# Patient Record
Sex: Female | Born: 2000 | Race: Black or African American | Hispanic: No | Marital: Single | Smoking: Current every day smoker
Health system: Southern US, Community
[De-identification: ages and names within clinical notes are randomized; demographics above are authoritative.]

## PROBLEM LIST (undated history)

## (undated) DIAGNOSIS — J45909 Unspecified asthma, uncomplicated: Secondary | ICD-10-CM

## (undated) HISTORY — PX: NO PAST SURGERIES: SHX2092

---

## 2020-10-31 ENCOUNTER — Inpatient Hospital Stay (HOSPITAL_COMMUNITY): Payer: Medicaid Other

## 2020-10-31 ENCOUNTER — Inpatient Hospital Stay (HOSPITAL_COMMUNITY)
Admission: EM | Admit: 2020-10-31 | Discharge: 2020-10-31 | Disposition: A | Discharge status: Home / Self Care | Payer: Medicaid Other | Attending: Obstetrics & Gynecology | Admitting: Obstetrics & Gynecology

## 2020-10-31 ENCOUNTER — Other Ambulatory Visit: Payer: Self-pay

## 2020-10-31 ENCOUNTER — Encounter (HOSPITAL_COMMUNITY): Payer: Self-pay | Admitting: Emergency Medicine

## 2020-10-31 DIAGNOSIS — Z3491 Encounter for supervision of normal pregnancy, unspecified, first trimester: Secondary | ICD-10-CM

## 2020-10-31 DIAGNOSIS — D279 Benign neoplasm of unspecified ovary: Secondary | ICD-10-CM

## 2020-10-31 DIAGNOSIS — Z3A01 Less than 8 weeks gestation of pregnancy: Secondary | ICD-10-CM | POA: Insufficient documentation

## 2020-10-31 DIAGNOSIS — O210 Mild hyperemesis gravidarum: Secondary | ICD-10-CM | POA: Insufficient documentation

## 2020-10-31 DIAGNOSIS — O99891 Other specified diseases and conditions complicating pregnancy: Secondary | ICD-10-CM | POA: Diagnosis not present

## 2020-10-31 DIAGNOSIS — O208 Other hemorrhage in early pregnancy: Secondary | ICD-10-CM | POA: Diagnosis not present

## 2020-10-31 DIAGNOSIS — O209 Hemorrhage in early pregnancy, unspecified: Secondary | ICD-10-CM

## 2020-10-31 DIAGNOSIS — O3481 Maternal care for other abnormalities of pelvic organs, first trimester: Secondary | ICD-10-CM

## 2020-10-31 DIAGNOSIS — D270 Benign neoplasm of right ovary: Secondary | ICD-10-CM | POA: Diagnosis not present

## 2020-10-31 DIAGNOSIS — O418X10 Other specified disorders of amniotic fluid and membranes, first trimester, not applicable or unspecified: Secondary | ICD-10-CM

## 2020-10-31 DIAGNOSIS — N83299 Other ovarian cyst, unspecified side: Secondary | ICD-10-CM

## 2020-10-31 DIAGNOSIS — O26899 Other specified pregnancy related conditions, unspecified trimester: Secondary | ICD-10-CM

## 2020-10-31 DIAGNOSIS — O468X1 Other antepartum hemorrhage, first trimester: Secondary | ICD-10-CM

## 2020-10-31 DIAGNOSIS — R109 Unspecified abdominal pain: Secondary | ICD-10-CM | POA: Insufficient documentation

## 2020-10-31 DIAGNOSIS — O26891 Other specified pregnancy related conditions, first trimester: Secondary | ICD-10-CM

## 2020-10-31 HISTORY — DX: Unspecified asthma, uncomplicated: J45.909

## 2020-10-31 LAB — URINALYSIS, ROUTINE W REFLEX MICROSCOPIC
Bacteria, UA: NONE SEEN
Bilirubin Urine: NEGATIVE
Glucose, UA: NEGATIVE mg/dL
Hgb urine dipstick: NEGATIVE
Ketones, ur: NEGATIVE mg/dL
Leukocytes,Ua: NEGATIVE
Nitrite: NEGATIVE
Protein, ur: 30 mg/dL — AB
Specific Gravity, Urine: 1.032 — ABNORMAL HIGH (ref 1.005–1.030)
pH: 5 (ref 5.0–8.0)

## 2020-10-31 LAB — CBC
HCT: 38.3 % (ref 36.0–46.0)
Hemoglobin: 12.7 g/dL (ref 12.0–15.0)
MCH: 27.1 pg (ref 26.0–34.0)
MCHC: 33.2 g/dL (ref 30.0–36.0)
MCV: 81.7 fL (ref 80.0–100.0)
Platelets: 275 10*3/uL (ref 150–400)
RBC: 4.69 MIL/uL (ref 3.87–5.11)
RDW: 13 % (ref 11.5–15.5)
WBC: 7.6 10*3/uL (ref 4.0–10.5)
nRBC: 0 % (ref 0.0–0.2)

## 2020-10-31 LAB — POC URINE PREG, ED: Preg Test, Ur: POSITIVE — AB

## 2020-10-31 LAB — WET PREP, GENITAL
Clue Cells Wet Prep HPF POC: NONE SEEN
Sperm: NONE SEEN
Trich, Wet Prep: NONE SEEN
Yeast Wet Prep HPF POC: NONE SEEN

## 2020-10-31 LAB — HCG, QUANTITATIVE, PREGNANCY: hCG, Beta Chain, Quant, S: 52059 m[IU]/mL — ABNORMAL HIGH (ref <5)

## 2020-10-31 LAB — ABO/RH: ABO/RH(D): A POS

## 2020-10-31 MED ORDER — PROMETHAZINE HCL 25 MG PO TABS: 25.0000 mg | ORAL_TABLET | Freq: Four times a day (QID) | ORAL | 0 refills | Status: DC | PRN

## 2020-10-31 MED ORDER — ONDANSETRON 4 MG PO TBDP: 4.0000 mg | ORAL_TABLET | Freq: Three times a day (TID) | ORAL | 0 refills | Status: DC | PRN

## 2020-10-31 MED ORDER — ONDANSETRON 4 MG PO TBDP
8.0000 mg | ORAL_TABLET | Freq: Once | ORAL | Status: AC
  Administered 2020-10-31: 8 mg via ORAL
  Filled 2020-10-31: qty 2

## 2020-10-31 NOTE — MAU Note (Signed)
Pt reports to mau with c/o lower abd pain that radiates to her back for the past week.  Pt states pain comes and goes and feels like mild period cramps.  Pt reports 1 episode of spotting on Friday, but nothing since then.  Pt endorses nausea and vomiting X 5 today.

## 2020-10-31 NOTE — ED Provider Notes (Signed)
Emergency Medicine Provider OB Triage Evaluation Note  Gina Holmes is a 20 y.o. female, G1P0 at Unknown gestation who presents to the emergency department with complaints of abdominal cramping x2 weeks and nausea. LMP 09/15/2020. Mild vaginal spotting 3 days ago. None since.   Review of  Systems  Positive: abdominal pain, nausea  Physical Exam  BP (!) 129/58 (BP Location: Left Arm)   Pulse 74   Temp 98.7 F (37.1 C) (Oral)   Resp 18   LMP 09/07/2020   SpO2 99%  General: Awake, no distress  HEENT: Atraumatic  Resp: Normal effort  Cardiac: Normal rate Abd: Nondistended, nontender  MSK: Moves all extremities without difficulty Neuro: Speech clear  Medical Decision Making  Pt evaluated for pregnancy concern and is stable for transfer to MAU. Pt is in agreement with plan for transfer.  6:19 PM Discussed with MAU APP, Sam, who accepts patient in transfer.  Clinical Impression  No diagnosis found. Of note, mini lab called Karolee Stamps, RN and states pregnancy test is positive; however, result has not crossed over. Sam APP at MAU made aware.    Suzy Bouchard, PA-C 10/31/20 1825    Lorelle Gibbs, DO 10/31/20 2116

## 2020-10-31 NOTE — Discharge Instructions (Signed)
Safe Medications in Pregnancy   Acne: Benzoyl Peroxide Salicylic Acid  Backache/Headache: Tylenol: 2 regular strength every 4 hours OR              2 Extra strength every 6 hours  Colds/Coughs/Allergies: Benadryl (alcohol free) 25 mg every 6 hours as needed Breath right strips Claritin Cepacol throat lozenges Chloraseptic throat spray Cold-Eeze- up to three times per day Cough drops, alcohol free Flonase (by prescription only) Guaifenesin Mucinex Robitussin DM (plain only, alcohol free) Saline nasal spray/drops Sudafed (pseudoephedrine) & Actifed ** use only after [redacted] weeks gestation and if you do not have high blood pressure Tylenol Vicks Vaporub Zinc lozenges Zyrtec   Constipation: Colace Ducolax suppositories Fleet enema Glycerin suppositories Metamucil Milk of magnesia Miralax Senokot Smooth move tea  Diarrhea: Kaopectate Imodium A-D  *NO pepto Bismol  Hemorrhoids: Anusol Anusol HC Preparation H Tucks  Indigestion: Tums Maalox Mylanta Zantac  Pepcid  Insomnia: Benadryl (alcohol free) 25mg  every 6 hours as needed Tylenol PM Unisom, no Gelcaps  Leg Cramps: Tums MagGel  Nausea/Vomiting:  Bonine Dramamine Emetrol Ginger extract Sea bands Meclizine  Nausea medication to take during pregnancy:  Unisom (doxylamine succinate 25 mg tablets) Take one tablet daily at bedtime. If symptoms are not adequately controlled, the dose can be increased to a maximum recommended dose of two tablets daily (1/2 tablet in the morning, 1/2 tablet mid-afternoon and one at bedtime). Vitamin B6 100mg  tablets. Take one tablet twice a day (up to 200 mg per day).  Skin Rashes: Aveeno products Benadryl cream or 25mg  every 6 hours as needed Calamine Lotion 1% cortisone cream  Yeast infection: Gyne-lotrimin 7 Monistat 7   **If taking multiple medications, please check labels to avoid duplicating the same active ingredients **take medication as directed on  the label ** Do not exceed 4000 mg of tylenol in 24 hours **Do not take medications that contain aspirin or ibuprofen    Obstetrics: Normal and Problem Pregnancies (7th ed., pp. 102-121). [place], [place]: Elsevier."> Textbook of Family Medicine (9th ed., pp. [phone]). [place], [place]: Elsevier [employer].">  First Trimester of Pregnancy  The first trimester of pregnancy starts on the first day of your last menstrual period until the end of week 12. This is months 1 through 3 of pregnancy. A week after a sperm fertilizes an egg, the egg will implant into the wall of the uterus and begin to develop into a baby. By the end of 12 weeks, all the baby's organs will be formed and the baby will be 2-3 inches in size. Body changes during your first trimester Your body goes through many changes during pregnancy. The changes vary and generally return to normal after your baby is born. Physical changes  You may gain or lose weight.  Your breasts may begin to grow larger and become tender. The tissue that surrounds your nipples (areola) may become darker.  Dark spots or blotches (chloasma or mask of pregnancy) may develop on your face.  You may have changes in your hair. These can include thickening or thinning of your hair or changes in texture. Health changes  You may feel nauseous, and you may vomit.  You may have heartburn.  You may develop headaches.  You may develop constipation.  Your gums may bleed and may be sensitive to brushing and flossing. Other changes  You may tire easily.  You may urinate more often.  Your menstrual periods will stop.  You may have a loss of appetite.  You may develop  cravings for certain kinds of food.  You may have changes in your emotions from day to day.  You may have more vivid and strange dreams. Follow these instructions at home: Medicines  Follow your health care provider's instructions regarding medicine use. Specific medicines may be  either safe or unsafe to take during pregnancy. Do not take any medicines unless told to by your health care provider.  Take a prenatal vitamin that contains at least 600 micrograms (mcg) of folic acid. Eating and drinking  Eat a healthy diet that includes fresh fruits and vegetables, whole grains, good sources of protein such as meat, eggs, or tofu, and low-fat dairy products.  Avoid raw meat and unpasteurized juice, milk, and cheese. These carry germs that can harm you and your baby.  If you feel nauseous or you vomit: ? Eat 4 or 5 small meals a day instead of 3 large meals. ? Try eating a few soda crackers. ? Drink liquids between meals instead of during meals.  You may need to take these actions to prevent or treat constipation: ? Drink enough fluid to keep your urine pale yellow. ? Eat foods that are high in fiber, such as beans, whole grains, and fresh fruits and vegetables. ? Limit foods that are high in fat and processed sugars, such as fried or sweet foods. Activity  Exercise only as directed by your health care provider. Most people can continue their usual exercise routine during pregnancy. Try to exercise for 30 minutes at least 5 days a week.  Stop exercising if you develop pain or cramping in the lower abdomen or lower back.  Avoid exercising if it is very hot or humid or if you are at high altitude.  Avoid heavy lifting.  If you choose to, you may have sex unless your health care provider tells you not to. Relieving pain and discomfort  Wear a good support bra to relieve breast tenderness.  Rest with your legs elevated if you have leg cramps or low back pain.  If you develop bulging veins (varicose veins) in your legs: ? Wear support hose as told by your health care provider. ? Elevate your feet for 15 minutes, 3-4 times a day. ? Limit salt in your diet. Safety  Wear your seat belt at all times when driving or riding in a car.  Talk with your health care  provider if someone is verbally or physically abusive to you.  Talk with your health care provider if you are feeling sad or have thoughts of hurting yourself. Lifestyle  Do not use hot tubs, steam rooms, or saunas.  Do not douche. Do not use tampons or scented sanitary pads.  Do not use herbal remedies, alcohol, illegal drugs, or medicines that are not approved by your health care provider. Chemicals in these products can harm your baby.  Do not use any products that contain nicotine or tobacco, such as cigarettes, e-cigarettes, and chewing tobacco. If you need help quitting, ask your health care provider.  Avoid cat litter boxes and soil used by cats. These carry germs that can cause birth defects in the baby and possibly loss of the unborn baby (fetus) by miscarriage or stillbirth. General instructions  During routine prenatal visits in the first trimester, your health care provider will do a physical exam, perform necessary tests, and ask you how things are going. Keep all follow-up visits. This is important.  Ask for help if you have counseling or nutritional needs during pregnancy. Your  health care provider can offer advice or refer you to specialists for help with various needs.  Schedule a dentist appointment. At home, brush your teeth with a soft toothbrush. Floss gently.  Write down your questions. Take them to your prenatal visits. Where to find more information  American Pregnancy Association: americanpregnancy.[place] and Gynecologists: PoolDevices.com.pt  Office on [employer]: KeywordPortfolios.com.br Contact a health care provider if you have:  Dizziness.  A fever.  Mild pelvic cramps, pelvic pressure, or nagging pain in the abdominal area.  Nausea, vomiting, or diarrhea that lasts for 24 hours or longer.  A bad-smelling vaginal discharge.  Pain when you urinate.  Known exposure to a contagious illness,  such as chickenpox, measles, Zika virus, HIV, or hepatitis. Get help right away if you have:  Spotting or bleeding from your vagina.  Severe abdominal cramping or pain.  Shortness of breath or chest pain.  Any kind of trauma, such as from a fall or a car crash.  New or increased pain, swelling, or redness in an arm or leg. Summary  The first trimester of pregnancy starts on the first day of your last menstrual period until the end of week 12 (months 1 through 3).  Eating 4 or 5 small meals a day rather than 3 large meals may help to relieve nausea and vomiting.  Do not use any products that contain nicotine or tobacco, such as cigarettes, e-cigarettes, and chewing tobacco. If you need help quitting, ask your health care provider.  Keep all follow-up visits. This is important. This information is not intended to replace advice given to you by your health care provider. Make sure you discuss any questions you have with your health care provider. Document Revised: 12/03/2019 Document Reviewed: 10/09/2019 Elsevier Patient Education  2021 [employer].

## 2020-10-31 NOTE — ED Triage Notes (Signed)
Pt reports + home preg test 1 week ago.  C/o abd cramping and nausea.

## 2020-10-31 NOTE — ED Notes (Signed)
Urine preg + per Mini lab.  PA at triage for MSE.  Report called to Sharin Mons.  Shawn, transporter notified of pt ready for MAU transport.

## 2020-10-31 NOTE — MAU Provider Note (Signed)
History     CSN: [id number]  Arrival date and time: 10/31/20 1714   Event Date/Time   First Provider Initiated Contact with Patient 10/31/20 1917      Chief Complaint  Patient presents with  . Abdominal Pain  . Back Pain  . Emesis  . Nausea   HPI Gina Holmes is a 20 y.o. G1P0 at [redacted]w[redacted]d who presents from [facility] with abdominal pain and nausea. She reports lower abdominal cramping that has been ongoing for 2-3 days. She rates the pain a 5/10 and has not tried anything for the pain. She denies any vaginal bleeding or discharge. She also reports nausea and vomiting 5 times today.   OB History    Gravida  1   Para      Term      Preterm      AB      Living        SAB      IAB      Ectopic      Multiple      Live Births              Past Medical History:  Diagnosis Date  . Asthma     History reviewed. No pertinent surgical history.  History reviewed. No pertinent family history.  Social History   Tobacco Use  . Smoking status: Never Smoker  . Smokeless tobacco: Never Used  Substance Use Topics  . Alcohol use: Not Currently  . Drug use: Yes    Types: Marijuana    Allergies: Not on File  No medications prior to admission.    Review of Systems  Constitutional: Negative.  Negative for fatigue and fever.  HENT: Negative.   Respiratory: Negative.  Negative for shortness of breath.   Cardiovascular: Negative.  Negative for chest pain.  Gastrointestinal: Positive for abdominal pain, nausea and vomiting. Negative for constipation and diarrhea.  Genitourinary: Negative.  Negative for dysuria, vaginal bleeding and vaginal discharge.  Neurological: Negative.  Negative for dizziness and headaches.   Physical Exam   Blood pressure (!) 135/52, pulse 64, temperature 98.5 F (36.9 C), temperature source Oral, resp. rate 17, last menstrual period 09/15/2020, SpO2 98 %.  Physical Exam Vitals and nursing note reviewed.  Constitutional:      General: She  is not in acute distress.    Appearance: She is well-developed.  HENT:     Head: Normocephalic.  Eyes:     Pupils: Pupils are equal, round, and reactive to light.  Cardiovascular:     Rate and Rhythm: Normal rate and regular rhythm.     Heart sounds: Normal heart sounds.  Pulmonary:     Effort: Pulmonary effort is normal. No respiratory distress.     Breath sounds: Normal breath sounds.  Abdominal:     General: Bowel sounds are normal. There is no distension.     Palpations: Abdomen is soft.     Tenderness: There is no abdominal tenderness.  Skin:    General: Skin is warm and dry.  Neurological:     Mental Status: She is alert and oriented to person, place, and time.  Psychiatric:        Behavior: Behavior normal.        Thought Content: Thought content normal.        Judgment: Judgment normal.     MAU Course  Procedures Results for orders placed or performed during the hospital encounter of 10/31/20 (from the past 24 hour(s))  POC Urine Pregnancy, ED (not at [facility])     Status: Abnormal   Collection Time: 10/31/20  6:15 PM  Result Value Ref Range   Preg Test, Ur POSITIVE (A) NEGATIVE  Wet prep, genital     Status: Abnormal   Collection Time: 10/31/20  7:20 PM  Result Value Ref Range   Yeast Wet Prep HPF POC NONE SEEN NONE SEEN   Trich, Wet Prep NONE SEEN NONE SEEN   Clue Cells Wet Prep HPF POC NONE SEEN NONE SEEN   WBC, Wet Prep HPF POC FEW (A) NONE SEEN   Sperm NONE SEEN   Urinalysis, Routine w reflex microscopic     Status: Abnormal   Collection Time: 10/31/20  7:22 PM  Result Value Ref Range   Color, Urine YELLOW YELLOW   APPearance HAZY (A) CLEAR   Specific Gravity, Urine 1.032 (H) 1.005 - 1.030   pH 5.0 5.0 - 8.0   Glucose, UA NEGATIVE NEGATIVE mg/dL   Hgb urine dipstick NEGATIVE NEGATIVE   Bilirubin Urine NEGATIVE NEGATIVE   Ketones, ur NEGATIVE NEGATIVE mg/dL   Protein, ur 30 (A) NEGATIVE mg/dL   Nitrite NEGATIVE NEGATIVE   Leukocytes,Ua NEGATIVE NEGATIVE    RBC / HPF 0-5 0 - 5 RBC/hpf   WBC, UA 0-5 0 - 5 WBC/hpf   Bacteria, UA NONE SEEN NONE SEEN   Squamous Epithelial / LPF 0-5 0 - 5   Mucus PRESENT   CBC     Status: None   Collection Time: 10/31/20  7:24 PM  Result Value Ref Range   WBC 7.6 4.0 - 10.5 K/uL   RBC 4.69 3.87 - 5.11 MIL/uL   Hemoglobin 12.7 12.0 - 15.0 g/dL   HCT 38.3 36.0 - 46.0 %   MCV 81.7 80.0 - 100.0 fL   MCH 27.1 26.0 - 34.0 pg   MCHC 33.2 30.0 - 36.0 g/dL   RDW 13.0 11.5 - 15.5 %   Platelets 275 150 - 400 K/uL   nRBC 0.0 0.0 - 0.2 %  ABO/Rh     Status: None   Collection Time: 10/31/20  7:24 PM  Result Value Ref Range   ABO/RH(D) A POS    No rh immune globuloin      NOT A RH IMMUNE GLOBULIN CANDIDATE, PT RH POSITIVE Performed at [facility], [address] [place], [place] [address]    US OB LESS THAN 14 WEEKS WITH [place] TRANSVAGINAL  Result Date: 10/31/2020 CLINICAL DATA:  Abdominal and back pain. Estimated gestational age by LMP is 6 weeks 4 days. Quantitative beta HCG is pending. EXAM: OBSTETRIC <14 WK [place] AND TRANSVAGINAL OB US TECHNIQUE: Both transabdominal and transvaginal ultrasound examinations were performed for complete evaluation of the gestation as well as the maternal uterus, adnexal regions, and pelvic cul-de-sac. Transvaginal technique was performed to assess early pregnancy. COMPARISON:  None. FINDINGS: Intrauterine gestational sac: A single intrauterine gestational sac is present. Yolk sac:  The yolk sac is visualized. Embryo:  The fetal pole is visualized. Cardiac Activity: Fetal cardiac activity is observed. Heart Rate: 116 bpm CRL:  4.8 mm   6 w   1 d                  [place] EDC: 06/25/2021 Subchorionic hemorrhage: A small subchorionic hemorrhage is identified inferiorly. Maternal uterus/adnexae: The uterus is retroverted. No myometrial mass lesions are identified. Both ovaries are visualized. The right ovary contains a heterogeneous mixed echotexture structure with hyperechoic changes predominant.  This measures about 2.7 cm in maximal diameter. Likely an ovarian dermoid cyst. The left ovary contains a corpus luteal cyst. Small amount of free fluid in the pelvis. IMPRESSION: 1. Single intrauterine pregnancy. Estimated gestational age by crown-rump length is 6 weeks 1 day. No acute complication is suggested sonographically. 2. Probable ovarian dermoid cyst in the right ovary measuring 2.7 cm maximal diameter. Electronically Signed   By: Lucienne Capers M.D.   On: 10/31/2020 20:10    MDM UA, UPT CBC, HCG ABO/Rh-  A Pos Wet prep and gc/chlamydia US OB Comp Less 14 weeks with Transvaginal  Zofran 8mg  ODT Assessment and Plan   1. Normal intrauterine pregnancy on prenatal ultrasound in first trimester   2. Abdominal pain affecting pregnancy   3. [redacted] weeks gestation of pregnancy   4. Subchorionic hemorrhage of placenta in first trimester, single or unspecified fetus   5. Ovarian dermoid cyst complicating pregnancy, antepartum    -Discharge home in stable condition -Rx for zofran and phenergan sent to patient's pharmacy -First trimester precautions discussed -Patient advised to follow-up with OB to establish prenatal care -Patient may return to MAU as needed or if her condition were to change or worsen   Wende Mott CNM 10/31/2020, 8:23 PM

## 2020-11-01 LAB — GC/CHLAMYDIA PROBE AMP (~~LOC~~) NOT AT ARMC
Chlamydia: NEGATIVE
Comment: NEGATIVE
Comment: NORMAL
Neisseria Gonorrhea: NEGATIVE

## 2020-11-15 ENCOUNTER — Inpatient Hospital Stay (HOSPITAL_COMMUNITY)
Admission: EM | Admit: 2020-11-15 | Discharge: 2020-11-16 | Disposition: A | Discharge status: Home / Self Care | Payer: Medicaid Other | Attending: Family Medicine | Admitting: Family Medicine

## 2020-11-15 ENCOUNTER — Other Ambulatory Visit: Payer: Self-pay

## 2020-11-15 DIAGNOSIS — O210 Mild hyperemesis gravidarum: Secondary | ICD-10-CM

## 2020-11-15 DIAGNOSIS — Z3A08 8 weeks gestation of pregnancy: Secondary | ICD-10-CM | POA: Insufficient documentation

## 2020-11-15 DIAGNOSIS — O219 Vomiting of pregnancy, unspecified: Secondary | ICD-10-CM

## 2020-11-15 DIAGNOSIS — Z79899 Other long term (current) drug therapy: Secondary | ICD-10-CM | POA: Insufficient documentation

## 2020-11-15 NOTE — MAU Note (Signed)
PT SAYS  SHE WENT TO [place] TONIGHT AT 10PM- FOR VOMITING   VOMITING STARTED WED- HAS BEEN HERE BEFORE- GAVE MEDS FOR VOMITING TOOK PHENERGAN- AT 5 PM - VOMITED 10 MIN LATER  NO ZOFRAN TODAY  NO DR YET.

## 2020-11-15 NOTE — ED Notes (Signed)
Provider at bedside

## 2020-11-15 NOTE — ED Triage Notes (Signed)
[redacted] weeks pregnant with vomiting x 5days and unable to keep anything down. Also reports lower abdominal pain.

## 2020-11-15 NOTE — ED Notes (Signed)
Called transport at this time.

## 2020-11-15 NOTE — ED Provider Notes (Signed)
Emergency Medicine Provider OB Triage Evaluation Note  Gina Holmes is a 20 y.o. female, G1P0, at [redacted]w[redacted]d gestation who presents to the emergency department with complaints of G2P1 - (prior miscarriage), n/v X 1 week - no improvement with meds - no abd pain.  Review of  Systems  Positive: n/v  Negative: f/c/sob/cough  Physical Exam  BP 115/63 (BP Location: Left Arm)   Pulse 86   Temp 99.5 F (37.5 C) (Oral)   Resp 16   Ht 1.549 m (5\' 1" )   Wt 86.2 kg   LMP 09/15/2020   SpO2 100%   BMI 35.90 kg/m  General: Awake, no distress  HEENT: Atraumatic  Resp: Normal effort  Cardiac: Normal rate Abd: Nondistended, nontender  MSK: Moves all extremities without difficulty Neuro: Speech clear  Medical Decision Making  Pt evaluated for pregnancy concern and is stable for transfer to MAU. Pt is in agreement with plan for transfer.  10:37 PM Discussed with MAU APP,  who accepts patient in transfer.  Clinical Impression  No diagnosis found.     Noemi Chapel, MD 11/15/20 2240

## 2020-11-15 NOTE — ED Notes (Signed)
Called for provider to see to be cleared for MAU.

## 2020-11-16 ENCOUNTER — Encounter (HOSPITAL_COMMUNITY): Payer: Self-pay | Admitting: Family Medicine

## 2020-11-16 DIAGNOSIS — Z79899 Other long term (current) drug therapy: Secondary | ICD-10-CM | POA: Diagnosis not present

## 2020-11-16 DIAGNOSIS — O210 Mild hyperemesis gravidarum: Secondary | ICD-10-CM | POA: Diagnosis present

## 2020-11-16 DIAGNOSIS — O219 Vomiting of pregnancy, unspecified: Secondary | ICD-10-CM | POA: Diagnosis not present

## 2020-11-16 DIAGNOSIS — Z3A08 8 weeks gestation of pregnancy: Secondary | ICD-10-CM | POA: Diagnosis not present

## 2020-11-16 LAB — URINALYSIS, ROUTINE W REFLEX MICROSCOPIC
Bacteria, UA: NONE SEEN
Bilirubin Urine: NEGATIVE
Glucose, UA: NEGATIVE mg/dL
Hgb urine dipstick: NEGATIVE
Ketones, ur: 5 mg/dL — AB
Leukocytes,Ua: NEGATIVE
Nitrite: NEGATIVE
Protein, ur: 30 mg/dL — AB
Specific Gravity, Urine: 1.023 (ref 1.005–1.030)
pH: 5 (ref 5.0–8.0)

## 2020-11-16 MED ORDER — ONDANSETRON 4 MG PO TBDP
8.0000 mg | ORAL_TABLET | Freq: Once | ORAL | Status: AC
  Administered 2020-11-16: 8 mg via ORAL
  Filled 2020-11-16: qty 2

## 2020-11-16 NOTE — MAU Provider Note (Signed)
Event Date/Time   First Provider Initiated Contact with Patient 11/16/20 0336     S Ms. Gina Holmes is a 20 y.o. G1P0 pregnant female at [redacted]w[redacted]d who presents to MAU today with complaint of nausea/vomiting related to pregnancy. Has presented to MAU for this issue before and been prescribed zofran and phenergan. This bout of vomiting began on Wednesday, tried taking phenergan this morning but threw it up 40min later. Has not tried zofran today. Presented initially to [facility] ED, then transferred to [place]. Last emesis prior to arrival to hospital. Denies any other physical symptoms.  Has not established routine OB care yet.  Pertinent items noted in HPI and remainder of comprehensive ROS otherwise negative.  O BP (!) 99/48 (BP Location: Right Arm)   Pulse 79   Temp 98.3 F (36.8 C) (Oral)   Resp 18   Ht 5' 1.5" (1.562 m)   Wt 190 lb 14.4 oz (86.6 kg)   LMP 09/15/2020   SpO2 100%   BMI 35.49 kg/m  Physical Exam Vitals and nursing note reviewed.  Constitutional:      General: She is not in acute distress.    Appearance: She is not ill-appearing.  HENT:     Head: Normocephalic and atraumatic.     Mouth/Throat:     Mouth: Mucous membranes are moist.  Eyes:     Pupils: Pupils are equal, round, and reactive to light.  Cardiovascular:     Rate and Rhythm: Normal rate.  Pulmonary:     Effort: Pulmonary effort is normal.  Abdominal:     General: Bowel sounds are normal.     Palpations: Abdomen is soft.     Tenderness: There is no abdominal tenderness.  Musculoskeletal:        General: Normal range of motion.  Skin:    General: Skin is warm and dry.     Capillary Refill: Capillary refill takes less than 2 seconds.     Coloration: Skin is not jaundiced or pale.  Neurological:     Mental Status: She is alert and oriented to person, place, and time.  Psychiatric:        Mood and Affect: Mood normal.        Behavior: Behavior normal.        Thought Content: Thought content normal.         Judgment: Judgment normal.    Results for orders placed or performed during the hospital encounter of 11/15/20 (from the past 24 hour(s))  Urinalysis, Routine w reflex microscopic Urine, Clean Catch     Status: Abnormal   Collection Time: 11/16/20  3:24 AM  Result Value Ref Range   Color, Urine YELLOW YELLOW   APPearance CLOUDY (A) CLEAR   Specific Gravity, Urine 1.023 1.005 - 1.030   pH 5.0 5.0 - 8.0   Glucose, UA NEGATIVE NEGATIVE mg/dL   Hgb urine dipstick NEGATIVE NEGATIVE   Bilirubin Urine NEGATIVE NEGATIVE   Ketones, ur 5 (A) NEGATIVE mg/dL   Protein, ur 30 (A) NEGATIVE mg/dL   Nitrite NEGATIVE NEGATIVE   Leukocytes,Ua NEGATIVE NEGATIVE   RBC / HPF 0-5 0 - 5 RBC/hpf   WBC, UA 0-5 0 - 5 WBC/hpf   Bacteria, UA NONE SEEN NONE SEEN   Squamous Epithelial / LPF 0-5 0 - 5   Mucus PRESENT    Pt able to tolerate po zofran, water and juice. Encouraged use of zofran and educated on use of phenergan vaginally if she cannot tolerate it orally.  Reviewed ways to manage hyperemesis.  A Nausea and vomiting of pregnancy [redacted] weeks gestation  P Discharge from MAU in stable condition with first trimester precautions Call [facility] to make appointment for routine OB care (pt has no insurance yet) Allergies as of 11/16/2020   No Known Allergies     Medication List    TAKE these medications   ondansetron 4 MG disintegrating tablet Commonly known as: Zofran ODT Take 1 tablet (4 mg total) by mouth every 8 (eight) hours as needed for nausea or vomiting.   promethazine 25 MG tablet Commonly known as: PHENERGAN Take 1 tablet (25 mg total) by mouth every 6 (six) hours as needed for nausea or vomiting.      Gabriel Carina, [place] 11/16/2020 3:38 AM

## 2020-11-16 NOTE — Discharge Instructions (Signed)
Hyperemesis Gravidarum Hyperemesis gravidarum is a severe form of nausea and vomiting that happens during pregnancy. Hyperemesis is worse than morning sickness. It may cause you to have nausea or vomiting all day for many days. It may keep you from eating and drinking enough food and liquids, which can lead to dehydration, malnutrition, and weight loss. Hyperemesis usually occurs during the first half (the first 20 weeks) of pregnancy. It often goes away once a woman is in her second half of pregnancy. However, sometimes hyperemesis continues through an entire pregnancy. What are the causes? The cause of this condition is not known. It may be associated with:  Changes in hormones in the body during pregnancy.  Changes in the gastrointestinal system.  Genetic or inherited conditions. What are the signs or symptoms? Symptoms of this condition include:  Severe nausea and vomiting that does not go away.  Problems keeping food down.  Weight loss.  Loss of body fluid (dehydration).  Loss of appetite. You may have no desire to eat or you may not like the food you have previously enjoyed. How is this diagnosed? This condition may be diagnosed based on your medical history, your symptoms, and a physical exam. You may also have other tests, including:  Blood tests.  Urine tests.  Blood pressure tests.  Ultrasound to look for problems with the placenta or to check if you are pregnant with more than one baby. How is this treated? This condition is managed by controlling symptoms. This may include:  Following an eating plan. This can help to lessen nausea and vomiting.  Treatments that do not use medicine. These include acupressure bracelets, hypnosis, and eating or drinking foods or fluids that contain ginger, ginger ale, or ginger tea.  Taking prescription medicine or over-the-counter medicine as told by your health care provider.  Continuing to take prenatal vitamins. You may need to  change what kind you take and when you take them. Follow your health care provider's instructions about prenatal vitamins. An eating plan and medicines are often used together to help control symptoms. If medicines do not help relieve nausea and vomiting, you may need to receive fluids through an IV at the hospital. Follow these instructions at home: To help relieve your symptoms, listen to your body. Everyone is different and has different preferences. Find what works best for you. Here are some things you can try to help relieve your symptoms: Meals and snacks  Eat 5-6 small meals daily instead of 3 large meals. Eating small meals and snacks can help you avoid an empty stomach.  Before getting out of bed, eat a couple of crackers to avoid moving around on an empty stomach.  Eat a protein-rich snack before bed. Examples include cheese and crackers, or a peanut butter sandwich made with 1 slice of whole-wheat bread and 1 tsp (5 g) of peanut butter.  Eat and drink slowly.  Try eating starchy foods as these are usually tolerated well. Examples include cereal, toast, bread, potatoes, pasta, rice, and pretzels.  Eat at least one serving of protein with your meals and snacks. Protein options include lean meats, poultry, seafood, beans, nuts, nut butters, eggs, cheese, and yogurt.  Eat or suck on things that have ginger in them. It may help to relieve nausea. Add  tsp (0.44 g) ground ginger to hot tea, or choose ginger tea.   Fluids It is important to stay hydrated. Try to:  Drink small amounts of fluids often.  Drink fluids 30 minutes  before or after a meal to help lessen the feeling of a full stomach.  Drink 100% fruit juice or an electrolyte drink. An electrolyte drink contains sodium, potassium, and chloride.  Drink fluids that are cold, clear, and carbonated or sour. These include lemonade, ginger ale, lemon-lime soda, ice water, and sparkling water. Things to avoid Avoid the  following:  Eating foods that trigger your symptoms. These may include spicy foods, coffee, high-fat foods, very sweet foods, and acidic foods.  Drinking more than 1 cup of fluid at a time.  Skipping meals. Nausea can be more intense on an empty stomach. If you cannot tolerate food, do not force it. Try sucking on ice chips or other frozen items and make up for missed calories later.  Lying down within 2 hours after eating.  Being exposed to environmental triggers. These may include food smells, smoky rooms, closed spaces, rooms with strong smells, warm or humid places, overly loud and noisy rooms, and rooms with motion or flickering lights. Try eating meals in a well-ventilated area that is free of strong smells.  Making quick and sudden changes in your movement.  Taking iron pills and multivitamins that contain iron. If you take prescription iron pills, do not stop taking them unless your health care provider approves.  Preparing food. The smell of food can spoil your appetite or trigger nausea. General instructions  Brush your teeth or use a mouth rinse after meals.  Take over-the-counter and prescription medicines only as told by your health care provider.  Follow instructions from your health care provider about eating or drinking restrictions.  Talk with your health care provider about starting a supplement of vitamin B6.  Continue to take your prenatal vitamins as told by your health care provider. If you are having trouble taking your prenatal vitamins, talk with your health care provider about other options.  Keep all follow-up visits. This is important. Follow-up visits include prenatal visits. Contact a health care provider if:  You have pain in your abdomen.  You have a severe headache.  You have vision problems.  You are losing weight.  You feel weak or dizzy.  You cannot eat or drink without vomiting, especially if this goes on for a full day. Get help right  away if:  You cannot drink fluids without vomiting.  You vomit blood.  You have constant nausea and vomiting.  You are very weak.  You faint.  You have a fever and your symptoms suddenly get worse. Summary  Hyperemesis gravidarum is a severe form of nausea and vomiting that happens during pregnancy.  Making some changes to your eating habits may help relieve nausea and vomiting.  This condition may be managed with lifestyle changes and medicines as prescribed by your health care provider.  If medicines do not help relieve nausea and vomiting, you may need to receive fluids through an IV at the hospital. This information is not intended to replace advice given to you by your health care provider. Make sure you discuss any questions you have with your health care provider. Document Revised: 01/19/2020 Document Reviewed: 01/19/2020 Elsevier Patient Education  2021 [employer].

## 2020-11-19 ENCOUNTER — Inpatient Hospital Stay (HOSPITAL_COMMUNITY)
Admission: AD | Admit: 2020-11-19 | Discharge: 2020-11-19 | Disposition: A | Discharge status: Home / Self Care | Payer: Medicaid Other | Attending: Obstetrics & Gynecology | Admitting: Obstetrics & Gynecology

## 2020-11-19 ENCOUNTER — Encounter (HOSPITAL_COMMUNITY): Payer: Self-pay | Admitting: Obstetrics and Gynecology

## 2020-11-19 ENCOUNTER — Other Ambulatory Visit: Payer: Self-pay

## 2020-11-19 DIAGNOSIS — O99281 Endocrine, nutritional and metabolic diseases complicating pregnancy, first trimester: Secondary | ICD-10-CM | POA: Diagnosis not present

## 2020-11-19 DIAGNOSIS — R112 Nausea with vomiting, unspecified: Secondary | ICD-10-CM

## 2020-11-19 DIAGNOSIS — O219 Vomiting of pregnancy, unspecified: Secondary | ICD-10-CM | POA: Diagnosis not present

## 2020-11-19 DIAGNOSIS — Z3A09 9 weeks gestation of pregnancy: Secondary | ICD-10-CM | POA: Diagnosis not present

## 2020-11-19 DIAGNOSIS — E860 Dehydration: Secondary | ICD-10-CM | POA: Insufficient documentation

## 2020-11-19 LAB — URINALYSIS, ROUTINE W REFLEX MICROSCOPIC
Bacteria, UA: NONE SEEN
Glucose, UA: NEGATIVE mg/dL
Hgb urine dipstick: NEGATIVE
Ketones, ur: 80 mg/dL — AB
Leukocytes,Ua: NEGATIVE
Nitrite: NEGATIVE
Protein, ur: 100 mg/dL — AB
Specific Gravity, Urine: 1.033 — ABNORMAL HIGH (ref 1.005–1.030)
pH: 5 (ref 5.0–8.0)

## 2020-11-19 LAB — COMPREHENSIVE METABOLIC PANEL
ALT: 17 U/L (ref 0–44)
AST: 27 U/L (ref 15–41)
Albumin: 3.8 g/dL (ref 3.5–5.0)
Alkaline Phosphatase: 34 U/L — ABNORMAL LOW (ref 38–126)
Anion gap: 11 (ref 5–15)
BUN: 11 mg/dL (ref 6–20)
CO2: 18 mmol/L — ABNORMAL LOW (ref 22–32)
Calcium: 9.1 mg/dL (ref 8.9–10.3)
Chloride: 104 mmol/L (ref 98–111)
Creatinine, Ser: 0.54 mg/dL (ref 0.44–1.00)
GFR, Estimated: >60 mL/min (ref >60)
Glucose, Bld: 78 mg/dL (ref 70–99)
Potassium: 3.5 mmol/L (ref 3.5–5.1)
Sodium: 133 mmol/L — ABNORMAL LOW (ref 135–145)
Total Bilirubin: 1.3 mg/dL — ABNORMAL HIGH (ref 0.3–1.2)
Total Protein: 6.9 g/dL (ref 6.5–8.1)

## 2020-11-19 LAB — TSH: TSH: 0.539 u[IU]/mL (ref 0.350–4.500)

## 2020-11-19 MED ORDER — FAMOTIDINE 20 MG PO TABS: 20.0000 mg | ORAL_TABLET | Freq: Two times a day (BID) | ORAL | 1 refills | Status: DC

## 2020-11-19 MED ORDER — LACTATED RINGERS IV BOLUS
1000.0000 mL | Freq: Once | INTRAVENOUS | Status: AC
  Administered 2020-11-19: 1000 mL via INTRAVENOUS

## 2020-11-19 MED ORDER — SODIUM CHLORIDE 0.9 % IV SOLN
8.0000 mg | Freq: Once | INTRAVENOUS | Status: AC
  Administered 2020-11-19: 8 mg via INTRAVENOUS
  Filled 2020-11-19: qty 4

## 2020-11-19 MED ORDER — ONDANSETRON 8 MG PO TBDP: 8.0000 mg | ORAL_TABLET | Freq: Three times a day (TID) | ORAL | 2 refills | Status: DC | PRN

## 2020-11-19 MED ORDER — FAMOTIDINE IN NACL 20-0.9 MG/50ML-% IV SOLN
20.0000 mg | Freq: Once | INTRAVENOUS | Status: AC
  Administered 2020-11-19: 20 mg via INTRAVENOUS
  Filled 2020-11-19: qty 50

## 2020-11-19 NOTE — MAU Note (Signed)
Pt reports for 2 weeks she has been having nausea and vomiting not able to keep anything down. Had been taking zofran an promethazine without much relief. Pt stated she feels weak and tired.

## 2020-11-19 NOTE — MAU Provider Note (Addendum)
History     CSN: [id number]  Arrival date and time: 11/19/20 1601   Event Date/Time   First Provider Initiated Contact with Patient 11/19/20 1644      Chief Complaint  Patient presents with  . Emesis   Gina Holmes is a 20yo G2P0010 [redacted]w[redacted]d who presents with a chief complaint of nausea, vomiting, and generalized fatigue. She states that her nausea and vomiting started 2 weeks ago and she has been having non-bloody emesis about 10 times a day. She is able to keep liquid down for a few hours but has been unable to keep in any solid food. She visited the MAU few days ago for the vomiting and was prescribed Zofran and Phenergan. Pt states that the Zofran helps for an hour or so but the Phenergan did not help and is sedating. She also has a slight headache but has no other symptoms including fever, sore throat, diarrhea and no symptoms of HTN.   OB History    Gravida  2   Para      Term      Preterm      AB  1   Living        SAB  1   IAB      Ectopic      Multiple      Live Births              Past Medical History:  Diagnosis Date  . Asthma     Past Surgical History:  Procedure Laterality Date  . NO PAST SURGERIES      No family history on file.  Social History   Tobacco Use  . Smoking status: Never Smoker  . Smokeless tobacco: Never Used  Substance Use Topics  . Alcohol use: Not Currently  . Drug use: Yes    Types: Marijuana    Comment: last used 1 week ago    Allergies:  Allergies  Allergen Reactions  . [employer] and Shortness Of Breath  . Cherry Hives and Shortness Of Breath  . Peach Flavor Hives  . Pear Hives    No medications prior to admission.    Review of Systems  Constitutional: Positive for appetite change (Decreased) and fatigue (due to reduced PO intake). Negative for fever and unexpected weight change.  HENT: Negative.   Eyes: Negative.   Respiratory: Negative.   Cardiovascular: Negative.    Gastrointestinal: Positive for constipation (Last BM was 2 days ago), nausea and vomiting.  Endocrine: Negative.   Genitourinary: Positive for decreased urine volume (likely due to dehydration). Negative for flank pain, hematuria, urgency, vaginal bleeding, vaginal discharge and vaginal pain. Difficulty urinating: likely due to dehydration.  Musculoskeletal: Negative.   Skin: Negative.   Allergic/Immunologic: Negative.   Neurological: Positive for headaches (pt endorses slight headache today). Negative for weakness.  Hematological: Negative.   Psychiatric/Behavioral: Negative.    Physical Exam   Blood pressure (!) 118/51, pulse 70, temperature 98.5 F (36.9 C), resp. rate 18, height 5' 1.5" (1.562 m), weight 86.2 kg, last menstrual period 09/15/2020.  Physical Exam Constitutional:      General: She is not in acute distress. HENT:     Nose: No congestion or rhinorrhea.  Cardiovascular:     Rate and Rhythm: Normal rate and regular rhythm.     Pulses: Normal pulses.     Heart sounds: No murmur heard. No friction rub. No gallop.   Pulmonary:     Effort: Pulmonary effort  is normal. No respiratory distress.     Breath sounds: No wheezing or rales.  Abdominal:     Tenderness: There is no abdominal tenderness. There is no guarding.  Skin:    General: Skin is warm and dry.  Neurological:     General: No focal deficit present.     Mental Status: She is alert.     MAU Course  Procedures  -Urinalysis, Routine w reflex microscopic urine -CMP - TSH  MDM  Given that the previous dose of Zofran resulted in minimal improvement of symptoms, the dosage of Zofran was increased to 8mg  and administered IV in the unit with 20mg  Pepcid adjunct. Urinalysis showed elevated protein, ketones, and specific gravity indicating dehydration so CMP was ordered to assess electrolyte status and pt was started on 2 1L LR bolus. TSH was ordered to rule out thyroid imbalances.  Assessment and Plan    #Nausea/Vomiting: Given the isolated nausea and vomiting with subsequent dehydration, pregnancy associated vomiting is the most likely etiology of the pt's symptoms. Additionally, with the headache considering preclampsia but given pt has no other symptoms and BP is appropriate, it is lower on the differential.  - TSH - Zofran 8mg  in 46mL IVPB - Pepcid IVBP 20mg  premix  #Dehydration: Pt's dehydration is most likely due to repeated vomiting and decreased PO intake.  - Urinalysis - CMP - LR 108mL X2   Gina Holmes 11/21/2020, 6:45 AM   Medical student attestation:  I have seen and examined this patient and agree with above documentation in the medical student's note  Gina Holmes is a 20 y.o. G2P0010 female reporting worsening nausea and vomiting. Reports not being able to keep anything down. She reports using zofran 4 mg every 8 hours which is no longer helping her. She is keeping down some liquids but not able to keep down food.  She is worried about the baby and wants to make sure baby is ok.   Associated symptoms:  neg fever and chills neg abdominal pain neg vaginal bleeding neg vaginal discharge neg urinary complaints neg GI complaints  PE: No data found. Gen: calm comfortable, NAD Resp: normal effort, no distress Heart: Regular rate Abd: Soft, NT, Pos BS x 4 Neuro: A&O x 4 Pelvic exam: deferred   ROS, labs, PMH reviewed  Orders Placed This Encounter  Procedures  . Urinalysis, Routine w reflex microscopic Urine, Clean Catch  . Comprehensive metabolic panel  . TSH  . Discharge patient   Meds ordered this encounter  Medications  . lactated ringers bolus 1,000 mL  . lactated ringers bolus 1,000 mL  . ondansetron (ZOFRAN) 8 mg in sodium chloride 0.9 % 50 mL IVPB  . famotidine (PEPCID) IVPB 20 mg premix  . ondansetron (ZOFRAN ODT) 8 MG disintegrating tablet    Sig: Take 1 tablet (8 mg total) by mouth every 8 (eight) hours as needed for nausea or vomiting.     Dispense:  20 tablet    Refill:  2    Order Specific Question:   Supervising Provider    Answer:   ERVIN, MICHAEL L [[id number]]  . famotidine (PEPCID) 20 MG tablet    Sig: Take 1 tablet (20 mg total) by mouth 2 (two) times daily.    Dispense:  60 tablet    Refill:  1    Order Specific Question:   Supervising Provider    Answer:   ERVIN, MICHAEL L [[id number]]    MDM   UA shows severe dehydration.  Patient is without weight loss LR bolus x 2 Zofran 8 mg & Pepcid Iv given Patient tolerating oral fluids. Bedside US shows active fetus.    Pt informed that the ultrasound is considered a limited OB ultrasound and is not intended to be a complete ultrasound exam.  Patient also informed that the ultrasound is not being completed with the intent of assessing for fetal or placental anomalies or any pelvic abnormalities.  Explained that the purpose of today's ultrasound is to assess for  viability.  Patient acknowledges the purpose of the exam and the limitations of the study.     Assessment 1. Non-intractable vomiting with nausea, unspecified vomiting type   2. [redacted] weeks gestation of pregnancy     Plan: - Discharge home in stable condition - Zofran 8 mg Q 8 hours - Rx: Pepcid  - Follow-up as scheduled at your doctor's office or sooner as needed if symptoms worsen. - Return to maternity admissions symptoms worsen - Brat diet  Gina Holmes, Artist Pais, NP 11/21/2020 6:45 AM

## 2020-11-24 ENCOUNTER — Encounter (HOSPITAL_COMMUNITY): Payer: Self-pay | Admitting: Obstetrics & Gynecology

## 2020-11-24 ENCOUNTER — Inpatient Hospital Stay (HOSPITAL_COMMUNITY)
Admission: AD | Admit: 2020-11-24 | Discharge: 2020-11-26 | DRG: 832 | Disposition: A | Discharge status: Home / Self Care | Payer: Medicaid Other | Attending: Obstetrics & Gynecology | Admitting: Obstetrics & Gynecology

## 2020-11-24 ENCOUNTER — Other Ambulatory Visit: Payer: Self-pay

## 2020-11-24 DIAGNOSIS — O99321 Drug use complicating pregnancy, first trimester: Secondary | ICD-10-CM | POA: Diagnosis present

## 2020-11-24 DIAGNOSIS — O210 Mild hyperemesis gravidarum: Secondary | ICD-10-CM | POA: Diagnosis not present

## 2020-11-24 DIAGNOSIS — O99891 Other specified diseases and conditions complicating pregnancy: Secondary | ICD-10-CM | POA: Diagnosis present

## 2020-11-24 DIAGNOSIS — O211 Hyperemesis gravidarum with metabolic disturbance: Secondary | ICD-10-CM | POA: Diagnosis not present

## 2020-11-24 DIAGNOSIS — O0931 Supervision of pregnancy with insufficient antenatal care, first trimester: Secondary | ICD-10-CM

## 2020-11-24 DIAGNOSIS — Z3490 Encounter for supervision of normal pregnancy, unspecified, unspecified trimester: Secondary | ICD-10-CM

## 2020-11-24 DIAGNOSIS — F1290 Cannabis use, unspecified, uncomplicated: Secondary | ICD-10-CM | POA: Diagnosis present

## 2020-11-24 DIAGNOSIS — K5900 Constipation, unspecified: Secondary | ICD-10-CM | POA: Diagnosis present

## 2020-11-24 DIAGNOSIS — Z3A10 10 weeks gestation of pregnancy: Secondary | ICD-10-CM | POA: Diagnosis not present

## 2020-11-24 DIAGNOSIS — Z20822 Contact with and (suspected) exposure to covid-19: Secondary | ICD-10-CM | POA: Diagnosis present

## 2020-11-24 DIAGNOSIS — O99331 Smoking (tobacco) complicating pregnancy, first trimester: Secondary | ICD-10-CM | POA: Diagnosis present

## 2020-11-24 DIAGNOSIS — F17210 Nicotine dependence, cigarettes, uncomplicated: Secondary | ICD-10-CM | POA: Diagnosis present

## 2020-11-24 LAB — URINALYSIS, ROUTINE W REFLEX MICROSCOPIC
Bilirubin Urine: NEGATIVE
Glucose, UA: NEGATIVE mg/dL
Ketones, ur: 80 mg/dL — AB
Leukocytes,Ua: NEGATIVE
Nitrite: NEGATIVE
Protein, ur: >=300 mg/dL — AB
Specific Gravity, Urine: 1.025 (ref 1.005–1.030)
pH: 6 (ref 5.0–8.0)

## 2020-11-24 LAB — RESP PANEL BY RT-PCR (FLU A&B, COVID) ARPGX2
Influenza A by PCR: NEGATIVE
Influenza B by PCR: NEGATIVE
SARS Coronavirus 2 by RT PCR: NEGATIVE

## 2020-11-24 LAB — CBC
HCT: 43.5 % (ref 36.0–46.0)
Hemoglobin: 14.9 g/dL (ref 12.0–15.0)
MCH: 27.1 pg (ref 26.0–34.0)
MCHC: 34.3 g/dL (ref 30.0–36.0)
MCV: 79.1 fL — ABNORMAL LOW (ref 80.0–100.0)
Platelets: 274 10*3/uL (ref 150–400)
RBC: 5.5 MIL/uL — ABNORMAL HIGH (ref 3.87–5.11)
RDW: 12.8 % (ref 11.5–15.5)
WBC: 6.2 10*3/uL (ref 4.0–10.5)
nRBC: 0 % (ref 0.0–0.2)

## 2020-11-24 LAB — COMPREHENSIVE METABOLIC PANEL
ALT: 44 U/L (ref 0–44)
AST: 32 U/L (ref 15–41)
Albumin: 4.4 g/dL (ref 3.5–5.0)
Alkaline Phosphatase: 44 U/L (ref 38–126)
Anion gap: 16 — ABNORMAL HIGH (ref 5–15)
BUN: 14 mg/dL (ref 6–20)
CO2: 16 mmol/L — ABNORMAL LOW (ref 22–32)
Calcium: 10.5 mg/dL — ABNORMAL HIGH (ref 8.9–10.3)
Chloride: 103 mmol/L (ref 98–111)
Creatinine, Ser: 0.82 mg/dL (ref 0.44–1.00)
GFR, Estimated: >60 mL/min (ref >60)
Glucose, Bld: 94 mg/dL (ref 70–99)
Potassium: 3.1 mmol/L — ABNORMAL LOW (ref 3.5–5.1)
Sodium: 135 mmol/L (ref 135–145)
Total Bilirubin: 2 mg/dL — ABNORMAL HIGH (ref 0.3–1.2)
Total Protein: 8.5 g/dL — ABNORMAL HIGH (ref 6.5–8.1)

## 2020-11-24 LAB — RAPID URINE DRUG SCREEN, HOSP PERFORMED
Amphetamines: NOT DETECTED
Barbiturates: NOT DETECTED
Benzodiazepines: NOT DETECTED
Cocaine: NOT DETECTED
Opiates: NOT DETECTED
Tetrahydrocannabinol: POSITIVE — AB

## 2020-11-24 MED ORDER — HALOPERIDOL LACTATE 5 MG/ML IJ SOLN
2.0000 mg | Freq: Once | INTRAMUSCULAR | Status: AC
  Administered 2020-11-24: 2 mg via INTRAVENOUS
  Filled 2020-11-24: qty 0.4

## 2020-11-24 MED ORDER — DIPHENHYDRAMINE HCL 50 MG/ML IJ SOLN
25.0000 mg | Freq: Once | INTRAMUSCULAR | Status: AC
  Administered 2020-11-24: 25 mg via INTRAVENOUS
  Filled 2020-11-24: qty 1

## 2020-11-24 MED ORDER — VITAMIN B-6 25 MG PO TABS
25.0000 mg | ORAL_TABLET | Freq: Two times a day (BID) | ORAL | Status: DC
  Administered 2020-11-24 – 2020-11-26 (×4): 25 mg via ORAL
  Filled 2020-11-24 (×4): qty 1

## 2020-11-24 MED ORDER — HYDROXYZINE HCL 50 MG PO TABS: 50.0000 mg | ORAL_TABLET | Freq: Four times a day (QID) | ORAL | Status: DC | PRN

## 2020-11-24 MED ORDER — FAMOTIDINE IN NACL 20-0.9 MG/50ML-% IV SOLN
20.0000 mg | Freq: Once | INTRAVENOUS | Status: AC
  Administered 2020-11-24: 20 mg via INTRAVENOUS
  Filled 2020-11-24: qty 50

## 2020-11-24 MED ORDER — LACTATED RINGERS IV BOLUS
1000.0000 mL | Freq: Once | INTRAVENOUS | Status: AC
  Administered 2020-11-24: 1000 mL via INTRAVENOUS

## 2020-11-24 MED ORDER — FAMOTIDINE 20 MG PO TABS
20.0000 mg | ORAL_TABLET | Freq: Two times a day (BID) | ORAL | Status: DC
  Administered 2020-11-25 – 2020-11-26 (×3): 20 mg via ORAL
  Filled 2020-11-24 (×3): qty 1

## 2020-11-24 MED ORDER — ONDANSETRON 4 MG PO TBDP
4.0000 mg | ORAL_TABLET | Freq: Three times a day (TID) | ORAL | Status: DC | PRN
Start: 2020-11-24 — End: 2020-11-26

## 2020-11-24 MED ORDER — POTASSIUM CHLORIDE 20 MEQ/50ML IV SOLN: 20.0000 meq | Freq: Once | INTRAVENOUS | Status: DC

## 2020-11-24 MED ORDER — SODIUM CHLORIDE 0.9 % IV SOLN
8.0000 mg | Freq: Three times a day (TID) | INTRAVENOUS | Status: DC | PRN
  Filled 2020-11-24: qty 4

## 2020-11-24 MED ORDER — SCOPOLAMINE 1 MG/3DAYS TD PT72
1.0000 | MEDICATED_PATCH | TRANSDERMAL | Status: DC
  Administered 2020-11-24: 1.5 mg via TRANSDERMAL
  Filled 2020-11-24: qty 1

## 2020-11-24 MED ORDER — HYDROXYZINE HCL 50 MG/ML IM SOLN
50.0000 mg | Freq: Four times a day (QID) | INTRAMUSCULAR | Status: DC | PRN
  Filled 2020-11-24: qty 1

## 2020-11-24 MED ORDER — POTASSIUM CHLORIDE 10 MEQ/100ML IV SOLN
10.0000 meq | INTRAVENOUS | Status: AC
  Administered 2020-11-24 (×2): 10 meq via INTRAVENOUS
  Filled 2020-11-24 (×2): qty 100

## 2020-11-24 MED ORDER — CALCIUM CARBONATE ANTACID 500 MG PO CHEW: 2.0000 | CHEWABLE_TABLET | ORAL | Status: DC | PRN

## 2020-11-24 MED ORDER — ACETAMINOPHEN 325 MG PO TABS
650.0000 mg | ORAL_TABLET | ORAL | Status: DC | PRN
Start: 2020-11-24 — End: 2020-11-26

## 2020-11-24 MED ORDER — M.V.I. ADULT IV INJ
Freq: Once | INTRAVENOUS | Status: AC
  Filled 2020-11-24: qty 10

## 2020-11-24 MED ORDER — FAMOTIDINE IN NACL 20-0.9 MG/50ML-% IV SOLN
20.0000 mg | Freq: Two times a day (BID) | INTRAVENOUS | Status: DC
  Administered 2020-11-24: 20 mg via INTRAVENOUS
  Filled 2020-11-24: qty 50

## 2020-11-24 MED ORDER — ONDANSETRON HCL 4 MG/2ML IJ SOLN: 4.0000 mg | Freq: Three times a day (TID) | INTRAMUSCULAR | Status: DC | PRN

## 2020-11-24 MED ORDER — DOCUSATE SODIUM 100 MG PO CAPS
100.0000 mg | ORAL_CAPSULE | Freq: Every day | ORAL | Status: DC
  Administered 2020-11-25 – 2020-11-26 (×2): 100 mg via ORAL
  Filled 2020-11-24 (×2): qty 1

## 2020-11-24 MED ORDER — THIAMINE HCL 100 MG/ML IJ SOLN
INTRAVENOUS | Status: DC
  Filled 2020-11-24 (×3): qty 1000

## 2020-11-24 MED ORDER — POTASSIUM CHLORIDE 2 MEQ/ML IV SOLN
INTRAVENOUS | Status: DC
  Filled 2020-11-24 (×3): qty 1000

## 2020-11-24 MED ORDER — DOXYLAMINE SUCCINATE (SLEEP) 25 MG PO TABS
25.0000 mg | ORAL_TABLET | Freq: Two times a day (BID) | ORAL | Status: DC
  Administered 2020-11-24 – 2020-11-26 (×4): 25 mg via ORAL
  Filled 2020-11-24 (×4): qty 1

## 2020-11-24 NOTE — Plan of Care (Signed)
  Problem: Clinical Measurements: Goal: Ability to maintain clinical measurements within normal limits will improve Outcome: Progressing   Problem: Pain Managment: Goal: General experience of comfort will improve Outcome: Progressing   Problem: Education: Goal: Knowledge of disease or condition will improve Outcome: Progressing   Problem: Clinical Measurements: Goal: Complications related to the disease process, condition or treatment will be avoided or minimized Outcome: Progressing   Problem: Bowel/Gastric: Goal: Occurences of nausea and/or vomiting will decrease Outcome: Progressing

## 2020-11-24 NOTE — H&P (Signed)
Gina Holmes is a 20 y.o. female presenting for evaluation of hyperemesis in first trimester. Patient is s/p evaluation for recurrent vomiting on 10/31/2020, 11/15/2020, 05/13 and today. She attempted outpatient management with Zofran and Phenergan but has been unable to tolerate PO intake.  Patient also reports musculoskeletal pain in her abdomen, lower back and across her chest muscles. Her chest pain is across her pec major muscles, worse on waking up in the morning but occurs at least once each day. She denies pain at or below her sternum. She denies palpitations, weakness and syncope. She denies personal or family history of acute cardiac events.  Patient also reports new onset constipation. She states she has not had any bowel movements in eight days. She is aware of interventions for constipation but is unable to pursue them due to her significant vomiting.  Patient endorses history of smoking marijuana. She states she has not smoked in two weeks.  Patient is scheduled for New OB appointment at [facility] on 11/30/2020.  OB History    Gravida  2   Para      Term      Preterm      AB  1   Living        SAB  1   IAB      Ectopic      Multiple      Live Births             Past Medical History:  Diagnosis Date  . Asthma    Past Surgical History:  Procedure Laterality Date  . NO PAST SURGERIES     Family History: family history is not on file. Social History:  reports that she has been smoking cigarettes. She has been smoking about 0.25 packs per day. She has never used smokeless tobacco. She reports previous alcohol use. She reports previous drug use. Drug: Marijuana.    Maternal Diabetes: No Genetic Screening: Patient has not initiated prenatal care Maternal Ultrasounds/Referrals: Normal  IUP confirmed  Fetal Ultrasounds or other Referrals:  None Maternal Substance Abuse:  Yes:  Type: Marijuana Significant Maternal Medications:  None Significant Maternal Lab  Results:  Other:  Ketonuria, THC + Other Comments:  None  Review of Systems  Gastrointestinal: Positive for abdominal pain and constipation.       No bowel movement in 8 days  Musculoskeletal: Positive for back pain.  All other systems reviewed and are negative.    Blood pressure 132/75, pulse 78, temperature 98 F (36.7 C), temperature source Oral, resp. rate 18, height 5' 1.5" (1.562 m), weight 80.5 kg, last menstrual period 09/15/2020, SpO2 100 %. Physical Exam Vitals and nursing note reviewed. Exam conducted with a chaperone present.  Constitutional:      Appearance: She is well-developed. She is ill-appearing.  HENT:     Mouth/Throat:     Mouth: Mucous membranes are dry.  Cardiovascular:     Rate and Rhythm: Normal rate and regular rhythm.     Heart sounds: Normal heart sounds.  Pulmonary:     Effort: Pulmonary effort is normal.     Breath sounds: Normal breath sounds.  Abdominal:     General: Abdomen is flat. Bowel sounds are normal.     Palpations: Abdomen is soft.     Tenderness: There is no abdominal tenderness. There is no right CVA tenderness or left CVA tenderness.  Skin:    Capillary Refill: Capillary refill takes less than 2 seconds.  Neurological:  Mental Status: She is alert and oriented to person, place, and time.     Prenatal labs:  ABO, Rh: --/--/A POS (04/24 1924)  Results for orders placed or performed during the hospital encounter of 11/24/20 (from the past 24 hour(s))  Urinalysis, Routine w reflex microscopic Urine, Clean Catch     Status: Abnormal   Collection Time: 11/24/20 12:23 PM  Result Value Ref Range   Color, Urine AMBER (A) YELLOW   APPearance HAZY (A) CLEAR   Specific Gravity, Urine 1.025 1.005 - 1.030   pH 6.0 5.0 - 8.0   Glucose, UA NEGATIVE NEGATIVE mg/dL   Hgb urine dipstick SMALL (A) NEGATIVE   Bilirubin Urine NEGATIVE NEGATIVE   Ketones, ur 80 (A) NEGATIVE mg/dL   Protein, ur >=300 (A) NEGATIVE mg/dL   Nitrite NEGATIVE  NEGATIVE   Leukocytes,Ua NEGATIVE NEGATIVE   RBC / HPF 11-20 0 - 5 RBC/hpf   WBC, UA 11-20 0 - 5 WBC/hpf   Bacteria, UA RARE (A) NONE SEEN   Squamous Epithelial / LPF 0-5 0 - 5   Mucus PRESENT    Granular Casts, UA PRESENT   Rapid urine drug screen (hospital performed)     Status: Abnormal   Collection Time: 11/24/20 12:29 PM  Result Value Ref Range   Opiates NONE DETECTED NONE DETECTED   Cocaine NONE DETECTED NONE DETECTED   Benzodiazepines NONE DETECTED NONE DETECTED   Amphetamines NONE DETECTED NONE DETECTED   Tetrahydrocannabinol POSITIVE (A) NONE DETECTED   Barbiturates NONE DETECTED NONE DETECTED  CBC     Status: Abnormal   Collection Time: 11/24/20 12:55 PM  Result Value Ref Range   WBC 6.2 4.0 - 10.5 K/uL   RBC 5.50 (H) 3.87 - 5.11 MIL/uL   Hemoglobin 14.9 12.0 - 15.0 g/dL   HCT 43.5 36.0 - 46.0 %   MCV 79.1 (L) 80.0 - 100.0 fL   MCH 27.1 26.0 - 34.0 pg   MCHC 34.3 30.0 - 36.0 g/dL   RDW 12.8 11.5 - 15.5 %   Platelets 274 150 - 400 K/uL   nRBC 0.0 0.0 - 0.2 %  Comprehensive metabolic panel     Status: Abnormal   Collection Time: 11/24/20 12:55 PM  Result Value Ref Range   Sodium 135 135 - 145 mmol/L   Potassium 3.1 (L) 3.5 - 5.1 mmol/L   Chloride 103 98 - 111 mmol/L   CO2 16 (L) 22 - 32 mmol/L   Glucose, Bld 94 70 - 99 mg/dL   BUN 14 6 - 20 mg/dL   Creatinine, Ser 0.82 0.44 - 1.00 mg/dL   Calcium 10.5 (H) 8.9 - 10.3 mg/dL   Total Protein 8.5 (H) 6.5 - 8.1 g/dL   Albumin 4.4 3.5 - 5.0 g/dL   AST 32 15 - 41 U/L   ALT 44 0 - 44 U/L   Alkaline Phosphatase 44 38 - 126 U/L   Total Bilirubin 2.0 (H) 0.3 - 1.2 mg/dL   GFR, Estimated >60 >60 mL/min   Anion gap 16 (H) 5 - 15   Assessment/Plan: --20 y.o. G2P0010 at [redacted]w[redacted]d  --Hyperemesis Gravidarum --6 kg loss since 11/15/2020 --THC +, concern for Cannabis Hyperemesis --Ketonuria --Unsuccessful outpatient management with Zofran and Phenergan --Tolerating clear liquids in MAU --Per Dr. Nelda Marseille, admit to  [facility], CNM 11/24/2020, 4:52 PM

## 2020-11-24 NOTE — MAU Note (Signed)
Spoke with Kenney Houseman in [place]. Pharmacist stated it was okay to run the IVPB KCl with her multivitamin bag with LR bolus. Potassium concentration is not enough to cause irritation.

## 2020-11-24 NOTE — MAU Note (Signed)
Hasn't pooped in like 10 days(hasn't taken anything).  Having back pain, abd pain, chest pains(sharp pains moves from side to side). They prescribed her some nausea meds and pepcid, but none of it works, she just throws it right back up.  Is starting to feel dehydrated again.

## 2020-11-25 DIAGNOSIS — O210 Mild hyperemesis gravidarum: Secondary | ICD-10-CM

## 2020-11-25 DIAGNOSIS — K5900 Constipation, unspecified: Secondary | ICD-10-CM

## 2020-11-25 DIAGNOSIS — Z3A10 10 weeks gestation of pregnancy: Secondary | ICD-10-CM

## 2020-11-25 DIAGNOSIS — O99611 Diseases of the digestive system complicating pregnancy, first trimester: Secondary | ICD-10-CM

## 2020-11-25 LAB — COMPREHENSIVE METABOLIC PANEL
ALT: 32 U/L (ref 0–44)
AST: 27 U/L (ref 15–41)
Albumin: 3.1 g/dL — ABNORMAL LOW (ref 3.5–5.0)
Alkaline Phosphatase: 29 U/L — ABNORMAL LOW (ref 38–126)
Anion gap: 5 (ref 5–15)
BUN: 8 mg/dL (ref 6–20)
CO2: 22 mmol/L (ref 22–32)
Calcium: 9 mg/dL (ref 8.9–10.3)
Chloride: 107 mmol/L (ref 98–111)
Creatinine, Ser: 0.64 mg/dL (ref 0.44–1.00)
GFR, Estimated: >60 mL/min (ref >60)
Glucose, Bld: 88 mg/dL (ref 70–99)
Potassium: 3.3 mmol/L — ABNORMAL LOW (ref 3.5–5.1)
Sodium: 134 mmol/L — ABNORMAL LOW (ref 135–145)
Total Bilirubin: 1.6 mg/dL — ABNORMAL HIGH (ref 0.3–1.2)
Total Protein: 6 g/dL — ABNORMAL LOW (ref 6.5–8.1)

## 2020-11-25 LAB — CBC
HCT: 32.6 % — ABNORMAL LOW (ref 36.0–46.0)
Hemoglobin: 11.2 g/dL — ABNORMAL LOW (ref 12.0–15.0)
MCH: 27.3 pg (ref 26.0–34.0)
MCHC: 34.4 g/dL (ref 30.0–36.0)
MCV: 79.3 fL — ABNORMAL LOW (ref 80.0–100.0)
Platelets: 199 10*3/uL (ref 150–400)
RBC: 4.11 MIL/uL (ref 3.87–5.11)
RDW: 13 % (ref 11.5–15.5)
WBC: 5.9 10*3/uL (ref 4.0–10.5)
nRBC: 0 % (ref 0.0–0.2)

## 2020-11-25 MED ORDER — POLYETHYLENE GLYCOL 3350 17 G PO PACK
17.0000 g | PACK | Freq: Every day | ORAL | Status: DC
  Administered 2020-11-25 – 2020-11-26 (×2): 17 g via ORAL
  Filled 2020-11-25 (×2): qty 1

## 2020-11-25 NOTE — Progress Notes (Signed)
FACULTY PRACTICE ANTEPARTUM(COMPREHENSIVE) NOTE  Gina Holmes is a 20 y.o. G2P0010 with Estimated Date of Delivery: 06/22/21   By  best clinical estimate [redacted]w[redacted]d  who is admitted for hyperemesis.    Length of Stay:  1  Days  Date of admission:11/24/2020  Subjective: Reports feeling better today- up eating CLD without problems.  States she has vomited since coming being on [facility] specialty unit.  Denies nausea currently.  Notes some stomach pain, but thinks this is due to the fact that she has not had a BM for over 2 weeks. Not yet feeling fetal movement Patient reports uterine contraction  activity as none. Patient reports  vaginal bleeding as none. Patient describes fluid per vagina as None.  Vitals:  Blood pressure (!) 117/50, pulse 64, temperature 98.5 F (36.9 C), temperature source Oral, resp. rate 18, height 5' 1.5" (1.562 m), weight 83.7 kg, last menstrual period 09/15/2020, SpO2 100 %. Vitals:   11/24/20 1703 11/24/20 1945 11/25/20 0548 11/25/20 0727  BP: (!) 115/58 117/61 (!) 102/52 (!) 117/50  Pulse: 64 65 67 64  Resp: 18 18  18   Temp: 98.4 F (36.9 C) 98.1 F (36.7 C) 98.5 F (36.9 C) 98.5 F (36.9 C)  TempSrc: Oral Oral Oral Oral  SpO2: 100% 100% 99% 100%  Weight:   83.7 kg   Height:       Physical Examination:  General appearance - alert, well appearing, and in no distress Mental status - alert, oriented to person, place, and time Chest - clear to auscultation, no wheezes, rales or rhonchi, symmetric air entry Heart - normal rate and regular rhythm Abdomen - soft and non-tender, no rebound, no guarding Extremities - no pedal edema noted Skin - warm and dry  Labs:  Results for orders placed or performed during the hospital encounter of 11/24/20 (from the past 24 hour(s))  Urinalysis, Routine w reflex microscopic Urine, Clean Catch   Collection Time: 11/24/20 12:23 PM  Result Value Ref Range   Color, Urine AMBER (A) YELLOW   APPearance HAZY (A) CLEAR   Specific  Gravity, Urine 1.025 1.005 - 1.030   pH 6.0 5.0 - 8.0   Glucose, UA NEGATIVE NEGATIVE mg/dL   Hgb urine dipstick SMALL (A) NEGATIVE   Bilirubin Urine NEGATIVE NEGATIVE   Ketones, ur 80 (A) NEGATIVE mg/dL   Protein, ur >=300 (A) NEGATIVE mg/dL   Nitrite NEGATIVE NEGATIVE   Leukocytes,Ua NEGATIVE NEGATIVE   RBC / HPF 11-20 0 - 5 RBC/hpf   WBC, UA 11-20 0 - 5 WBC/hpf   Bacteria, UA RARE (A) NONE SEEN   Squamous Epithelial / LPF 0-5 0 - 5   Mucus PRESENT    Granular Casts, UA PRESENT   Rapid urine drug screen (hospital performed)   Collection Time: 11/24/20 12:29 PM  Result Value Ref Range   Opiates NONE DETECTED NONE DETECTED   Cocaine NONE DETECTED NONE DETECTED   Benzodiazepines NONE DETECTED NONE DETECTED   Amphetamines NONE DETECTED NONE DETECTED   Tetrahydrocannabinol POSITIVE (A) NONE DETECTED   Barbiturates NONE DETECTED NONE DETECTED  CBC   Collection Time: 11/24/20 12:55 PM  Result Value Ref Range   WBC 6.2 4.0 - 10.5 K/uL   RBC 5.50 (H) 3.87 - 5.11 MIL/uL   Hemoglobin 14.9 12.0 - 15.0 g/dL   HCT 43.5 36.0 - 46.0 %   MCV 79.1 (L) 80.0 - 100.0 fL   MCH 27.1 26.0 - 34.0 pg   MCHC 34.3 30.0 - 36.0 g/dL  RDW 12.8 11.5 - 15.5 %   Platelets 274 150 - 400 K/uL   nRBC 0.0 0.0 - 0.2 %  Comprehensive metabolic panel   Collection Time: 11/24/20 12:55 PM  Result Value Ref Range   Sodium 135 135 - 145 mmol/L   Potassium 3.1 (L) 3.5 - 5.1 mmol/L   Chloride 103 98 - 111 mmol/L   CO2 16 (L) 22 - 32 mmol/L   Glucose, Bld 94 70 - 99 mg/dL   BUN 14 6 - 20 mg/dL   Creatinine, Ser 0.82 0.44 - 1.00 mg/dL   Calcium 10.5 (H) 8.9 - 10.3 mg/dL   Total Protein 8.5 (H) 6.5 - 8.1 g/dL   Albumin 4.4 3.5 - 5.0 g/dL   AST 32 15 - 41 U/L   ALT 44 0 - 44 U/L   Alkaline Phosphatase 44 38 - 126 U/L   Total Bilirubin 2.0 (H) 0.3 - 1.2 mg/dL   GFR, Estimated >60 >60 mL/min   Anion gap 16 (H) 5 - 15  Resp Panel by RT-PCR (Flu A&B, Covid) Nasopharyngeal Swab   Collection Time: 11/24/20  4:48  PM   Specimen: Nasopharyngeal Swab; Nasopharyngeal(NP) swabs in vial transport medium  Result Value Ref Range   SARS Coronavirus 2 by RT PCR NEGATIVE NEGATIVE   Influenza A by PCR NEGATIVE NEGATIVE   Influenza B by PCR NEGATIVE NEGATIVE  CBC   Collection Time: 11/25/20  7:19 AM  Result Value Ref Range   WBC 5.9 4.0 - 10.5 K/uL   RBC 4.11 3.87 - 5.11 MIL/uL   Hemoglobin 11.2 (L) 12.0 - 15.0 g/dL   HCT 32.6 (L) 36.0 - 46.0 %   MCV 79.3 (L) 80.0 - 100.0 fL   MCH 27.3 26.0 - 34.0 pg   MCHC 34.4 30.0 - 36.0 g/dL   RDW 13.0 11.5 - 15.5 %   Platelets 199 150 - 400 K/uL   nRBC 0.0 0.0 - 0.2 %  Comprehensive metabolic panel   Collection Time: 11/25/20  7:19 AM  Result Value Ref Range   Sodium 134 (L) 135 - 145 mmol/L   Potassium 3.3 (L) 3.5 - 5.1 mmol/L   Chloride 107 98 - 111 mmol/L   CO2 22 22 - 32 mmol/L   Glucose, Bld 88 70 - 99 mg/dL   BUN 8 6 - 20 mg/dL   Creatinine, Ser 0.64 0.44 - 1.00 mg/dL   Calcium 9.0 8.9 - 10.3 mg/dL   Total Protein 6.0 (L) 6.5 - 8.1 g/dL   Albumin 3.1 (L) 3.5 - 5.0 g/dL   AST 27 15 - 41 U/L   ALT 32 0 - 44 U/L   Alkaline Phosphatase 29 (L) 38 - 126 U/L   Total Bilirubin 1.6 (H) 0.3 - 1.2 mg/dL   GFR, Estimated >60 >60 mL/min   Anion gap 5 5 - 15    Imaging Studies:    No results found.   Medications:  Scheduled . docusate sodium  100 mg Oral Daily  . vitamin B-6  25 mg Oral BID   And  . doxylamine (Sleep)  25 mg Oral BID  . famotidine  20 mg Oral Q12H  . polyethylene glycol  17 g Oral Daily  . scopolamine  1 patch Transdermal Q72H   I have reviewed the patient's current medications.  ASSESSMENT: G2P0010 [redacted]w[redacted]d Estimated Date of Delivery: 06/22/21  Patient Active Problem List   Diagnosis Date Noted  . Intrauterine pregnancy 11/24/2020  . Marijuana use 11/24/2020  .  Hyperemesis affecting pregnancy, antepartum 11/24/2020    PLAN: 1) Hyperemesis -advance diet to regular -continue scheduled IV anti-emetics- if tolerating diet today-  plan to transition to oral medication -continue IV fluids  2) Hypokalemia -improved  3) Constipation -miralax ordered, may consider enema if needed  DISP: If able to tolerate oral diet today and oral anti-emetics this afternoon, possible discharge home tomorrow  Janyth Pupa, DO Attending [place], [place] for [employer], [place]

## 2020-11-26 ENCOUNTER — Other Ambulatory Visit (HOSPITAL_COMMUNITY): Payer: Self-pay

## 2020-11-26 DIAGNOSIS — F1290 Cannabis use, unspecified, uncomplicated: Secondary | ICD-10-CM

## 2020-11-26 DIAGNOSIS — O99321 Drug use complicating pregnancy, first trimester: Secondary | ICD-10-CM

## 2020-11-26 MED ORDER — PYRIDOXINE HCL 25 MG PO TABS
25.0000 mg | ORAL_TABLET | Freq: Two times a day (BID) | ORAL | 0 refills | Status: AC
  Filled 2020-11-26: qty 14, 7d supply, fill #0

## 2020-11-26 MED ORDER — DOXYLAMINE-PYRIDOXINE 10-10 MG PO TBEC
1.0000 | DELAYED_RELEASE_TABLET | Freq: Every day | ORAL | 2 refills | Status: DC
Start: 2020-11-26 — End: 2021-06-01

## 2020-11-26 MED ORDER — DOXYLAMINE SUCCINATE (SLEEP) 25 MG PO TABS
25.0000 mg | ORAL_TABLET | Freq: Two times a day (BID) | ORAL | 0 refills | Status: DC
  Filled 2020-11-26: qty 14, 7d supply, fill #0

## 2020-11-26 NOTE — Plan of Care (Signed)
  Problem: Education: Goal: Knowledge of General Education information will improve Description: Including pain rating scale, medication(s)/side effects and non-pharmacologic comfort measures Outcome: Adequate for Discharge   Problem: Health Behavior/Discharge Planning: Goal: Ability to manage health-related needs will improve Outcome: Adequate for Discharge   Problem: Clinical Measurements: Goal: Ability to maintain clinical measurements within normal limits will improve Outcome: Adequate for Discharge Goal: Will remain free from infection Outcome: Adequate for Discharge Goal: Diagnostic test results will improve Outcome: Adequate for Discharge Goal: Respiratory complications will improve Outcome: Adequate for Discharge Goal: Cardiovascular complication will be avoided Outcome: Adequate for Discharge   Problem: Activity: Goal: Risk for activity intolerance will decrease Outcome: Adequate for Discharge   Problem: Nutrition: Goal: Adequate nutrition will be maintained Outcome: Adequate for Discharge   Problem: Coping: Goal: Level of anxiety will decrease Outcome: Adequate for Discharge   Problem: Elimination: Goal: Will not experience complications related to bowel motility Outcome: Adequate for Discharge Goal: Will not experience complications related to urinary retention Outcome: Adequate for Discharge   Problem: Pain Managment: Goal: General experience of comfort will improve Outcome: Adequate for Discharge   Problem: Safety: Goal: Ability to remain free from injury will improve Outcome: Adequate for Discharge   Problem: Skin Integrity: Goal: Risk for impaired skin integrity will decrease Outcome: Adequate for Discharge   Problem: Education: Goal: Knowledge of disease or condition will improve Outcome: Adequate for Discharge Goal: Knowledge of the prescribed therapeutic regimen will improve Outcome: Adequate for Discharge Goal: Individualized Educational  Video(s) Outcome: Adequate for Discharge   Problem: Clinical Measurements: Goal: Complications related to the disease process, condition or treatment will be avoided or minimized Outcome: Adequate for Discharge   Problem: Education: Goal: Knowledge of disease or condition will improve Outcome: Adequate for Discharge Goal: Knowledge of the prescribed therapeutic regimen will improve Outcome: Adequate for Discharge   Problem: Bowel/Gastric: Goal: Occurences of nausea and/or vomiting will decrease Outcome: Adequate for Discharge   Problem: Fluid Volume: Goal: Maintenance of adequate hydration will improve Outcome: Adequate for Discharge   Problem: Nutritional: Goal: Achievement of adequate weight for body size and type will improve Outcome: Adequate for Discharge

## 2020-11-26 NOTE — Discharge Instructions (Addendum)
Morning Sickness  Morning sickness is when you feel like you may vomit (feel nauseous) during pregnancy. Sometimes, you may vomit. Morning sickness most often happens in the morning, but it can also happen at any time of the day. Some women may have morning sickness that makes them vomit all the time. This is a more serious problem that needs treatment. What are the causes? The cause of this condition is not known. What increases the risk?  You had vomiting or a feeling like you may vomit before your pregnancy.  You had morning sickness in another pregnancy.  You are pregnant with more than one baby, such as twins. What are the signs or symptoms?  Feeling like you may vomit.  Vomiting. How is this treated? Treatment is usually not needed for this condition. You may only need to change what you eat. In some cases, your doctor may give you some things to take for your condition. These include:  Vitamin B6 supplements.  Medicines to treat the feeling that you may vomit.  Ginger. Follow these instructions at home: Medicines  Take over-the-counter and prescription medicines only as told by your doctor.   A prescription for Diclegis was sent in (this a combination of the pyroxidine (B6) and unisom).  Once you are on Medicaid, they will cover this medication and you can stop the over the counter medication and take this instead.  Do not take any medicines until you talk with your doctor about them first.  Take multivitamins before you get pregnant. These can stop or lessen the symptoms of morning sickness. Eating and drinking  Eat dry toast or crackers before getting out of bed.  Eat 5 or 6 small meals a day.  Eat dry and bland foods like rice and baked potatoes.  Do not eat greasy, fatty, or spicy foods.  Have someone cook for you if the smell of food causes you to vomit or to feel like you may vomit.  If you feel like you may vomit after taking prenatal vitamins, take them  at night or with a snack.  Eat protein foods when you need a snack. Nuts, yogurt, and cheese are good choices.  Drink fluids throughout the day.  Try ginger ale made with real ginger, ginger tea made from fresh grated ginger, or ginger candies. General instructions  Do not smoke or use any products that contain nicotine or tobacco. If you need help quitting, ask your doctor.  Use an air purifier to keep the air in your house free of smells.  Get lots of fresh air.  Try to avoid smells that make you feel sick.  Try wearing an acupressure wristband. This is a wristband that is used to treat seasickness.  Try a treatment called acupuncture. In this treatment, a doctor puts needles into certain areas of your body to make you feel better. Contact a doctor if:  You need medicine to feel better.  You feel dizzy or light-headed.  You are losing weight. Get help right away if:  The feeling that you may vomit will not go away, or you cannot stop vomiting.  You faint.  You have very bad pain in your belly. Summary  Morning sickness is when you feel like you may vomit (feel nauseous) during pregnancy.  You may feel sick in the morning, but you can feel this way at any time of the day.  Making some changes to what you eat may help your symptoms go away. This information is  not intended to replace advice given to you by your health care provider. Make sure you discuss any questions you have with your health care provider. Document Revised: 02/09/2020 Document Reviewed: 01/19/2020 Elsevier Patient Education  2021 [employer].

## 2020-11-26 NOTE — Discharge Summary (Signed)
Physician Discharge Summary  Patient ID: Gina Holmes MRN: [id number] DOB/AGE: 12-Mar-2001 20 y.o.  Admit date: 11/24/2020 Discharge date: 11/26/2020  Admission Diagnoses: Hyperemesis affecting pregnancy, intrauterine pregnancy, marijuana use  Discharge Diagnoses:  Active Problems:   Intrauterine pregnancy   Marijuana use   Hyperemesis affecting pregnancy, antepartum   Discharged Condition: stable  Hospital Course: 20yo G1P0 admitted due to hyperemesis.  Pt was treated with IV fluids and anti-emetics.  Her diet was advanced as tolerated.  By her 2nd day she was tolerating regular diet and oral medications. Discharged home in stable condition with plans for outpatient follow up.  Consults: None  Significant Diagnostic Studies: labs: No results found for this or any previous visit (from the past 24 hour(s)).   Treatments: IV hydration and anti-emetics  Discharge Exam: Blood pressure (!) 112/55, pulse 64, temperature 98.2 F (36.8 C), temperature source Oral, resp. rate 18, height 5' 1.5" (1.562 m), weight 79.7 kg, last menstrual period 09/15/2020, SpO2 99 %. General appearance: alert, cooperative and no distress Resp: clear to auscultation bilaterally Cardio: regular rate and rhythm GI: soft and non-tender Pelvic: external genitalia normal, vaginal exam completed- minimal stool noted in vault Extremities: edema absent Skin: warm and dry  Disposition: Discharge disposition: 01-Home or Self Care        Allergies as of 11/26/2020      Reactions   [employer], Shortness Of Breath   [employer], Shortness Of Breath   Peach Flavor Hives   Pear Hives      Medication List    TAKE these medications   doxylamine (Sleep) 25 MG tablet Commonly known as: UNISOM Take 1 tablet (25 mg total) by mouth 2 (two) times daily.   Doxylamine-Pyridoxine 10-10 MG Tbec Commonly known as: Diclegis Take 1 tablet by mouth at bedtime.   famotidine 20 MG tablet Commonly  known as: PEPCID Take 1 tablet (20 mg total) by mouth 2 (two) times daily.   ondansetron 8 MG disintegrating tablet Commonly known as: Zofran ODT Take 1 tablet (8 mg total) by mouth every 8 (eight) hours as needed for nausea or vomiting.   promethazine 25 MG tablet Commonly known as: PHENERGAN Take 1 tablet (25 mg total) by mouth every 6 (six) hours as needed for nausea or vomiting.   pyridOXINE 25 MG tablet Commonly known as: VITAMIN B-6 Take 1 tablet (25 mg total) by mouth 2 (two) times daily.       Follow-up Information    Department, [facility] Follow up.   Why: Follow up as scheduled Contact information: [place] [address] [phone]               Signed: Annalee Genta 11/26/2020, 9:57 AM

## 2020-12-14 ENCOUNTER — Other Ambulatory Visit: Payer: Self-pay

## 2020-12-14 ENCOUNTER — Inpatient Hospital Stay (HOSPITAL_COMMUNITY)
Admission: AD | Admit: 2020-12-14 | Discharge: 2020-12-14 | Disposition: A | Discharge status: Home / Self Care | Payer: Medicaid Other | Attending: Obstetrics and Gynecology | Admitting: Obstetrics and Gynecology

## 2020-12-14 ENCOUNTER — Encounter (HOSPITAL_COMMUNITY): Payer: Self-pay | Admitting: Obstetrics and Gynecology

## 2020-12-14 DIAGNOSIS — F17210 Nicotine dependence, cigarettes, uncomplicated: Secondary | ICD-10-CM | POA: Insufficient documentation

## 2020-12-14 DIAGNOSIS — F1290 Cannabis use, unspecified, uncomplicated: Secondary | ICD-10-CM | POA: Insufficient documentation

## 2020-12-14 DIAGNOSIS — O99321 Drug use complicating pregnancy, first trimester: Secondary | ICD-10-CM | POA: Insufficient documentation

## 2020-12-14 DIAGNOSIS — Z79899 Other long term (current) drug therapy: Secondary | ICD-10-CM | POA: Diagnosis not present

## 2020-12-14 DIAGNOSIS — Z3A12 12 weeks gestation of pregnancy: Secondary | ICD-10-CM | POA: Diagnosis not present

## 2020-12-14 DIAGNOSIS — O219 Vomiting of pregnancy, unspecified: Secondary | ICD-10-CM

## 2020-12-14 DIAGNOSIS — O99331 Smoking (tobacco) complicating pregnancy, first trimester: Secondary | ICD-10-CM | POA: Diagnosis not present

## 2020-12-14 LAB — URINALYSIS, ROUTINE W REFLEX MICROSCOPIC
Bilirubin Urine: NEGATIVE
Glucose, UA: NEGATIVE mg/dL
Hgb urine dipstick: NEGATIVE
Ketones, ur: 80 mg/dL — AB
Leukocytes,Ua: NEGATIVE
Nitrite: NEGATIVE
Protein, ur: 100 mg/dL — AB
Specific Gravity, Urine: 1.031 — ABNORMAL HIGH (ref 1.005–1.030)
pH: 6 (ref 5.0–8.0)

## 2020-12-14 MED ORDER — FAMOTIDINE IN NACL 20-0.9 MG/50ML-% IV SOLN
20.0000 mg | Freq: Once | INTRAVENOUS | Status: AC
  Administered 2020-12-14: 20 mg via INTRAVENOUS
  Filled 2020-12-14: qty 50

## 2020-12-14 MED ORDER — LACTATED RINGERS IV BOLUS
1000.0000 mL | Freq: Once | INTRAVENOUS | Status: AC
  Administered 2020-12-14: 1000 mL via INTRAVENOUS

## 2020-12-14 MED ORDER — SODIUM CHLORIDE 0.9 % IV SOLN
25.0000 mg | Freq: Once | INTRAVENOUS | Status: AC
  Administered 2020-12-14: 25 mg via INTRAVENOUS
  Filled 2020-12-14: qty 1

## 2020-12-14 MED ORDER — PROMETHAZINE HCL 25 MG PO TABS: 25.0000 mg | ORAL_TABLET | Freq: Four times a day (QID) | ORAL | 0 refills | Status: DC | PRN

## 2020-12-14 NOTE — MAU Provider Note (Signed)
History     CSN: [id number]  Arrival date and time: 12/14/20 1215   Event Date/Time   First Provider Initiated Contact with Patient 12/14/20 1340      Chief Complaint  Patient presents with  . Abdominal Pain  . Emesis   HPI Gina Holmes is a 20 y.o. G2P0010 at [redacted]w[redacted]d who presents with nausea & vomiting. This has been an ongoing issue with the pregnancy. Was admitted last month for hyperemesis. Is currently taking diclegis & pepcid for her symptoms but hasn't been able to keep them down since yesterday. Reports vomiting 7 times today. Has some lower abdominal cramping that is worse with vomiting. Rates pain 6/10. Hasn't treated symptoms. Denies vaginal bleeding, fever, dysuria, or vaginal discharge. Last BM was today.  Goes [facility].    Past Medical History:  Diagnosis Date  . Asthma     Past Surgical History:  Procedure Laterality Date  . NO PAST SURGERIES      History reviewed. No pertinent family history.  Social History   Tobacco Use  . Smoking status: Current Every Day Smoker    Packs/day: 0.25    Types: Cigarettes  . Smokeless tobacco: Never Used  . Tobacco comment: 1 cigarrette per day  Vaping Use  . Vaping Use: Never used  Substance Use Topics  . Alcohol use: Not Currently  . Drug use: Yes    Types: Marijuana    Comment: last used on Sunday     Allergies:  Allergies  Allergen Reactions  . [employer] and Shortness Of Breath  . Cherry Hives and Shortness Of Breath  . Peach Flavor Hives  . Pear Hives    Medications Prior to Admission  Medication Sig Dispense Refill Last Dose  . doxylamine, Sleep, (UNISOM) 25 MG tablet Take 1 tablet (25 mg total) by mouth 2 (two) times daily. 60 tablet 0 Past Month at Unknown time  . Doxylamine-Pyridoxine (DICLEGIS) 10-10 MG TBEC Take 1 tablet by mouth at bedtime. 60 tablet 2 12/13/2020 at Unknown time  . famotidine (PEPCID) 20 MG tablet Take 1 tablet (20 mg total) by mouth 2 (two) times daily. 60 tablet 1  12/13/2020 at Unknown time  . ondansetron (ZOFRAN ODT) 8 MG disintegrating tablet Take 1 tablet (8 mg total) by mouth every 8 (eight) hours as needed for nausea or vomiting. 20 tablet 2 Past Month at Unknown time  . promethazine (PHENERGAN) 25 MG tablet Take 1 tablet (25 mg total) by mouth every 6 (six) hours as needed for nausea or vomiting. 30 tablet 0 Past Month at Unknown time  . pyridOXINE (VITAMIN B-6) 25 MG tablet Take 1 tablet (25 mg total) by mouth 2 (two) times daily. 60 tablet 0 12/13/2020 at Unknown time  . escitalopram (LEXAPRO) 10 MG tablet Take 1 tablet by mouth daily.     . hydrOXYzine (ATARAX/VISTARIL) 50 MG tablet Take 50 mg by mouth 2 (two) times daily as needed.       Review of Systems  Constitutional: Negative.   Gastrointestinal: Positive for abdominal pain, nausea and vomiting. Negative for constipation and diarrhea.  Genitourinary: Negative.    Physical Exam   Blood pressure (!) 114/48, pulse 68, temperature 98.4 F (36.9 C), resp. rate 14, height 5' 1.5" (1.562 m), weight 87 kg, last menstrual period 09/15/2020, SpO2 100 %.  Physical Exam Vitals and nursing note reviewed.  Constitutional:      General: She is not in acute distress.    Appearance: She is  well-developed. She is not ill-appearing.  HENT:     Head: Normocephalic and atraumatic.     Mouth/Throat:     Mouth: Mucous membranes are moist.  Pulmonary:     Effort: Pulmonary effort is normal.  Abdominal:     General: Abdomen is flat. There is no distension.     Palpations: Abdomen is soft.     Tenderness: There is no abdominal tenderness. There is no guarding.  Skin:    General: Skin is warm and dry.  Neurological:     Mental Status: She is alert.  Psychiatric:        Mood and Affect: Mood normal.        Behavior: Behavior normal.     MAU Course  Procedures Results for orders placed or performed during the hospital encounter of 12/14/20 (from the past 24 hour(s))  Urinalysis, Routine w reflex  microscopic Urine, Clean Catch     Status: Abnormal   Collection Time: 12/14/20  1:25 PM  Result Value Ref Range   Color, Urine AMBER (A) YELLOW   APPearance HAZY (A) CLEAR   Specific Gravity, Urine 1.031 (H) 1.005 - 1.030   pH 6.0 5.0 - 8.0   Glucose, UA NEGATIVE NEGATIVE mg/dL   Hgb urine dipstick NEGATIVE NEGATIVE   Bilirubin Urine NEGATIVE NEGATIVE   Ketones, ur 80 (A) NEGATIVE mg/dL   Protein, ur 100 (A) NEGATIVE mg/dL   Nitrite NEGATIVE NEGATIVE   Leukocytes,Ua NEGATIVE NEGATIVE   RBC / HPF 0-5 0 - 5 RBC/hpf   WBC, UA 0-5 0 - 5 WBC/hpf   Bacteria, UA RARE (A) NONE SEEN   Squamous Epithelial / LPF 0-5 0 - 5   Mucus PRESENT      MDM FHT present via doppler IV fluids, phenergan, & pepcid given to patient. No further vomiting  Assessment and Plan   1. Nausea and vomiting during pregnancy prior to [redacted] weeks gestation   2. [redacted] weeks gestation of pregnancy    Rx phenergan Reviewed reasons to return to [place] 12/14/2020, 4:57 PM

## 2020-12-14 NOTE — Discharge Instructions (Signed)
Hyperemesis Gravidarum Hyperemesis gravidarum is a severe form of nausea and vomiting that happens during pregnancy. Hyperemesis is worse than morning sickness. It may cause you to have nausea or vomiting all day for many days. It may keep you from eating and drinking enough food and liquids, which can lead to dehydration, malnutrition, and weight loss. Hyperemesis usually occurs during the first half (the first 20 weeks) of pregnancy. It often goes away once a woman is in her second half of pregnancy. However, sometimes hyperemesis continues through an entire pregnancy. What are the causes? The cause of this condition is not known. It may be associated with:  Changes in hormones in the body during pregnancy.  Changes in the gastrointestinal system.  Genetic or inherited conditions. What are the signs or symptoms? Symptoms of this condition include:  Severe nausea and vomiting that does not go away.  Problems keeping food down.  Weight loss.  Loss of body fluid (dehydration).  Loss of appetite. You may have no desire to eat or you may not like the food you have previously enjoyed. How is this diagnosed? This condition may be diagnosed based on your medical history, your symptoms, and a physical exam. You may also have other tests, including:  Blood tests.  Urine tests.  Blood pressure tests.  Ultrasound to look for problems with the placenta or to check if you are pregnant with more than one baby. How is this treated? This condition is managed by controlling symptoms. This may include:  Following an eating plan. This can help to lessen nausea and vomiting.  Treatments that do not use medicine. These include acupressure bracelets, hypnosis, and eating or drinking foods or fluids that contain ginger, ginger ale, or ginger tea.  Taking prescription medicine or over-the-counter medicine as told by your health care provider.  Continuing to take prenatal vitamins. You may need to  change what kind you take and when you take them. Follow your health care provider's instructions about prenatal vitamins. An eating plan and medicines are often used together to help control symptoms. If medicines do not help relieve nausea and vomiting, you may need to receive fluids through an IV at the hospital. Follow these instructions at home: To help relieve your symptoms, listen to your body. Everyone is different and has different preferences. Find what works best for you. Here are some things you can try to help relieve your symptoms: Meals and snacks  Eat 5-6 small meals daily instead of 3 large meals. Eating small meals and snacks can help you avoid an empty stomach.  Before getting out of bed, eat a couple of crackers to avoid moving around on an empty stomach.  Eat a protein-rich snack before bed. Examples include cheese and crackers, or a peanut butter sandwich made with 1 slice of whole-wheat bread and 1 tsp (5 g) of peanut butter.  Eat and drink slowly.  Try eating starchy foods as these are usually tolerated well. Examples include cereal, toast, bread, potatoes, pasta, rice, and pretzels.  Eat at least one serving of protein with your meals and snacks. Protein options include lean meats, poultry, seafood, beans, nuts, nut butters, eggs, cheese, and yogurt.  Eat or suck on things that have ginger in them. It may help to relieve nausea. Add  tsp (0.44 g) ground ginger to hot tea, or choose ginger tea.   Fluids It is important to stay hydrated. Try to:  Drink small amounts of fluids often.  Drink fluids 30 minutes  before or after a meal to help lessen the feeling of a full stomach.  Drink 100% fruit juice or an electrolyte drink. An electrolyte drink contains sodium, potassium, and chloride.  Drink fluids that are cold, clear, and carbonated or sour. These include lemonade, ginger ale, lemon-lime soda, ice water, and sparkling water. Things to avoid Avoid the  following:  Eating foods that trigger your symptoms. These may include spicy foods, coffee, high-fat foods, very sweet foods, and acidic foods.  Drinking more than 1 cup of fluid at a time.  Skipping meals. Nausea can be more intense on an empty stomach. If you cannot tolerate food, do not force it. Try sucking on ice chips or other frozen items and make up for missed calories later.  Lying down within 2 hours after eating.  Being exposed to environmental triggers. These may include food smells, smoky rooms, closed spaces, rooms with strong smells, warm or humid places, overly loud and noisy rooms, and rooms with motion or flickering lights. Try eating meals in a well-ventilated area that is free of strong smells.  Making quick and sudden changes in your movement.  Taking iron pills and multivitamins that contain iron. If you take prescription iron pills, do not stop taking them unless your health care provider approves.  Preparing food. The smell of food can spoil your appetite or trigger nausea. General instructions  Brush your teeth or use a mouth rinse after meals.  Take over-the-counter and prescription medicines only as told by your health care provider.  Follow instructions from your health care provider about eating or drinking restrictions.  Talk with your health care provider about starting a supplement of vitamin B6.  Continue to take your prenatal vitamins as told by your health care provider. If you are having trouble taking your prenatal vitamins, talk with your health care provider about other options.  Keep all follow-up visits. This is important. Follow-up visits include prenatal visits. Contact a health care provider if:  You have pain in your abdomen.  You have a severe headache.  You have vision problems.  You are losing weight.  You feel weak or dizzy.  You cannot eat or drink without vomiting, especially if this goes on for a full day. Get help right  away if:  You cannot drink fluids without vomiting.  You vomit blood.  You have constant nausea and vomiting.  You are very weak.  You faint.  You have a fever and your symptoms suddenly get worse. Summary  Hyperemesis gravidarum is a severe form of nausea and vomiting that happens during pregnancy.  Making some changes to your eating habits may help relieve nausea and vomiting.  This condition may be managed with lifestyle changes and medicines as prescribed by your health care provider.  If medicines do not help relieve nausea and vomiting, you may need to receive fluids through an IV at the hospital. This information is not intended to replace advice given to you by your health care provider. Make sure you discuss any questions you have with your health care provider. Document Revised: 01/19/2020 Document Reviewed: 01/19/2020 Elsevier Patient Education  2021 [employer].

## 2020-12-14 NOTE — MAU Note (Signed)
Just been throwing up a lot, can't keep nothing down.  Been having some pain in lower abd.  Feeling weak. Last took medication for nausea yesterday, "threw it right back up".

## 2020-12-16 LAB — OB RESULTS CONSOLE RUBELLA ANTIBODY, IGM: Rubella: IMMUNE

## 2020-12-16 LAB — OB RESULTS CONSOLE HIV ANTIBODY (ROUTINE TESTING): HIV: NONREACTIVE

## 2020-12-16 LAB — OB RESULTS CONSOLE ABO/RH: RH Type: POSITIVE

## 2020-12-16 LAB — HEPATITIS C ANTIBODY: HCV Ab: NEGATIVE

## 2020-12-16 LAB — OB RESULTS CONSOLE VARICELLA ZOSTER ANTIBODY, IGG: Varicella: IMMUNE

## 2020-12-16 LAB — OB RESULTS CONSOLE GC/CHLAMYDIA
Chlamydia: NEGATIVE
Gonorrhea: NEGATIVE

## 2020-12-16 LAB — OB RESULTS CONSOLE ANTIBODY SCREEN: Antibody Screen: NEGATIVE

## 2020-12-16 LAB — OB RESULTS CONSOLE HEPATITIS B SURFACE ANTIGEN: Hepatitis B Surface Ag: NEGATIVE

## 2020-12-16 LAB — OB RESULTS CONSOLE RPR: RPR: NONREACTIVE

## 2020-12-20 ENCOUNTER — Other Ambulatory Visit: Payer: Self-pay | Admitting: Family Medicine

## 2020-12-20 DIAGNOSIS — Z363 Encounter for antenatal screening for malformations: Secondary | ICD-10-CM

## 2020-12-22 ENCOUNTER — Encounter (HOSPITAL_COMMUNITY): Payer: Self-pay | Admitting: Obstetrics & Gynecology

## 2020-12-22 ENCOUNTER — Inpatient Hospital Stay (HOSPITAL_COMMUNITY)
Admission: AD | Admit: 2020-12-22 | Discharge: 2020-12-23 | Disposition: A | Discharge status: Home / Self Care | Payer: Medicaid Other | Attending: Obstetrics & Gynecology | Admitting: Obstetrics & Gynecology

## 2020-12-22 DIAGNOSIS — O211 Hyperemesis gravidarum with metabolic disturbance: Secondary | ICD-10-CM | POA: Diagnosis not present

## 2020-12-22 DIAGNOSIS — F17210 Nicotine dependence, cigarettes, uncomplicated: Secondary | ICD-10-CM | POA: Insufficient documentation

## 2020-12-22 DIAGNOSIS — Z3A14 14 weeks gestation of pregnancy: Secondary | ICD-10-CM | POA: Diagnosis not present

## 2020-12-22 DIAGNOSIS — O210 Mild hyperemesis gravidarum: Secondary | ICD-10-CM

## 2020-12-22 DIAGNOSIS — E860 Dehydration: Secondary | ICD-10-CM

## 2020-12-22 DIAGNOSIS — O99332 Smoking (tobacco) complicating pregnancy, second trimester: Secondary | ICD-10-CM | POA: Diagnosis not present

## 2020-12-22 DIAGNOSIS — O99612 Diseases of the digestive system complicating pregnancy, second trimester: Secondary | ICD-10-CM | POA: Diagnosis not present

## 2020-12-22 DIAGNOSIS — K5900 Constipation, unspecified: Secondary | ICD-10-CM | POA: Diagnosis not present

## 2020-12-22 DIAGNOSIS — F1290 Cannabis use, unspecified, uncomplicated: Secondary | ICD-10-CM

## 2020-12-22 LAB — COMPREHENSIVE METABOLIC PANEL
ALT: 24 U/L (ref 0–44)
AST: 27 U/L (ref 15–41)
Albumin: 3.5 g/dL (ref 3.5–5.0)
Alkaline Phosphatase: 37 U/L — ABNORMAL LOW (ref 38–126)
Anion gap: 11 (ref 5–15)
BUN: 10 mg/dL (ref 6–20)
CO2: 20 mmol/L — ABNORMAL LOW (ref 22–32)
Calcium: 9.2 mg/dL (ref 8.9–10.3)
Chloride: 102 mmol/L (ref 98–111)
Creatinine, Ser: 0.54 mg/dL (ref 0.44–1.00)
GFR, Estimated: >60 mL/min (ref >60)
Glucose, Bld: 78 mg/dL (ref 70–99)
Potassium: 3.6 mmol/L (ref 3.5–5.1)
Sodium: 133 mmol/L — ABNORMAL LOW (ref 135–145)
Total Bilirubin: 1.1 mg/dL (ref 0.3–1.2)
Total Protein: 6.9 g/dL (ref 6.5–8.1)

## 2020-12-22 LAB — URINALYSIS, ROUTINE W REFLEX MICROSCOPIC
Bacteria, UA: NONE SEEN
Bilirubin Urine: NEGATIVE
Glucose, UA: 150 mg/dL — AB
Hgb urine dipstick: NEGATIVE
Ketones, ur: 80 mg/dL — AB
Leukocytes,Ua: NEGATIVE
Nitrite: NEGATIVE
Protein, ur: 100 mg/dL — AB
Specific Gravity, Urine: 1.034 — ABNORMAL HIGH (ref 1.005–1.030)
pH: 5 (ref 5.0–8.0)

## 2020-12-22 MED ORDER — DEXAMETHASONE SODIUM PHOSPHATE 10 MG/ML IJ SOLN
10.0000 mg | Freq: Once | INTRAMUSCULAR | Status: AC
  Administered 2020-12-22: 10 mg via INTRAVENOUS
  Filled 2020-12-22 (×2): qty 1

## 2020-12-22 MED ORDER — LACTATED RINGERS IV SOLN: Freq: Once | INTRAVENOUS | Status: AC

## 2020-12-22 MED ORDER — ONDANSETRON HCL 4 MG/2ML IJ SOLN
4.0000 mg | Freq: Once | INTRAMUSCULAR | Status: AC
  Administered 2020-12-22: 4 mg via INTRAVENOUS
  Filled 2020-12-22: qty 2

## 2020-12-22 NOTE — MAU Note (Signed)
N/V for 3 days. Nausea meds are not helping anymore. Some lower abd pain. No VB.

## 2020-12-22 NOTE — MAU Provider Note (Signed)
Chief Complaint: Emesis During Pregnancy   Event Date/Time   First Provider Initiated Contact with Patient 12/22/20 2212        SUBJECTIVE HPI: Gina Holmes is a 20 y.o. G2P0010 at [redacted]w[redacted]d by LMP who presents to maternity admissions reporting persistent nausea and vomiting.  States none of her meds are helping. Has some intermittent lower abdominal pain. . She denies vaginal bleeding, vaginal itching/burning, urinary symptoms, h/a, dizziness, n/v, or fever/chills.    Emesis  This is a recurrent problem. The problem occurs intermittently. The problem has been unchanged. There has been no fever. Associated symptoms include abdominal pain. Pertinent negatives include no chest pain, chills, diarrhea, dizziness, fever, headaches or myalgias. Treatments tried: nausea meds. The treatment provided no relief.   RN Note N/V for 3 days. Nausea meds are not helping anymore. Some lower abd pain. No VB.   Past Medical History:  Diagnosis Date   Asthma    Past Surgical History:  Procedure Laterality Date   NO PAST SURGERIES     Social History   Socioeconomic History   Marital status: Single    Spouse name: Not on file   Number of children: Not on file   Years of education: Not on file   Highest education level: Not on file  Occupational History   Not on file  Tobacco Use   Smoking status: Every Day    Packs/day: 0.25    Pack years: 0.00    Types: Cigarettes   Smokeless tobacco: Never   Tobacco comments:    1 cigarrette per day  Vaping Use   Vaping Use: Never used  Substance and Sexual Activity   Alcohol use: Not Currently   Drug use: Yes    Types: Marijuana    Comment: last used on Sunday    Sexual activity: Yes  Other Topics Concern   Not on file  Social History Narrative   Not on file   Social Determinants of Health   Financial Resource Strain: Not on file  Food Insecurity: Not on file  Transportation Needs: Not on file  Physical Activity: Not on file  Stress: Not on  file  Social Connections: Not on file  Intimate Partner Violence: Not on file   No current facility-administered medications on file prior to encounter.   Current Outpatient Medications on File Prior to Encounter  Medication Sig Dispense Refill   doxylamine, Sleep, (UNISOM) 25 MG tablet Take 1 tablet (25 mg total) by mouth 2 (two) times daily. 60 tablet 0   Doxylamine-Pyridoxine (DICLEGIS) 10-10 MG TBEC Take 1 tablet by mouth at bedtime. 60 tablet 2   escitalopram (LEXAPRO) 10 MG tablet Take 1 tablet by mouth daily.     famotidine (PEPCID) 20 MG tablet Take 1 tablet (20 mg total) by mouth 2 (two) times daily. 60 tablet 1   hydrOXYzine (ATARAX/VISTARIL) 50 MG tablet Take 50 mg by mouth 2 (two) times daily as needed.     ondansetron (ZOFRAN ODT) 8 MG disintegrating tablet Take 1 tablet (8 mg total) by mouth every 8 (eight) hours as needed for nausea or vomiting. 20 tablet 2   promethazine (PHENERGAN) 25 MG tablet Take 1 tablet (25 mg total) by mouth every 6 (six) hours as needed for nausea or vomiting. 30 tablet 0   promethazine (PHENERGAN) 25 MG tablet Take 1 tablet (25 mg total) by mouth every 6 (six) hours as needed for nausea or vomiting. 30 tablet 0   pyridOXINE (VITAMIN B-6) 25 MG tablet  Take 1 tablet (25 mg total) by mouth 2 (two) times daily. 60 tablet 0   Allergies  Allergen Reactions   Black Development worker, community and Shortness Of Breath   [employer] and Shortness Of Breath   Peach Flavor Hives   Pear Hives    I have reviewed patient's Past Medical Hx, Surgical Hx, Family Hx, Social Hx, medications and allergies.   ROS:  Review of Systems  Constitutional:  Negative for chills and fever.  Cardiovascular:  Negative for chest pain.  Gastrointestinal:  Positive for abdominal pain and vomiting. Negative for diarrhea.  Musculoskeletal:  Negative for myalgias.  Neurological:  Negative for dizziness and headaches.  Review of Systems  Other systems negative   Physical Exam   Physical Exam Patient Vitals for the past 24 hrs:  BP Temp Pulse Resp SpO2  12/22/20 2131 -- -- -- -- 100 %  12/22/20 2130 131/71 -- 91 -- --  12/22/20 2128 -- 98.5 F (36.9 C) -- 16 --   Constitutional: Well-developed, well-nourished female in no acute distress.  Cardiovascular: normal rate Respiratory: normal effort GI: Abd soft, non-tender. Pos BS x 4 MS: Extremities nontender, no edema, normal ROM Neurologic: Alert and oriented x 4.  GU: Neg CVAT.  FHR:  158  LAB RESULTS Results for orders placed or performed during the hospital encounter of 12/22/20 (from the past 24 hour(s))  Urinalysis, Routine w reflex microscopic Urine, Clean Catch     Status: Abnormal   Collection Time: 12/22/20  9:43 PM  Result Value Ref Range   Color, Urine AMBER (A) YELLOW   APPearance HAZY (A) CLEAR   Specific Gravity, Urine 1.034 (H) 1.005 - 1.030   pH 5.0 5.0 - 8.0   Glucose, UA 150 (A) NEGATIVE mg/dL   Hgb urine dipstick NEGATIVE NEGATIVE   Bilirubin Urine NEGATIVE NEGATIVE   Ketones, ur 80 (A) NEGATIVE mg/dL   Protein, ur 100 (A) NEGATIVE mg/dL   Nitrite NEGATIVE NEGATIVE   Leukocytes,Ua NEGATIVE NEGATIVE   RBC / HPF 0-5 0 - 5 RBC/hpf   WBC, UA 0-5 0 - 5 WBC/hpf   Bacteria, UA NONE SEEN NONE SEEN   Squamous Epithelial / LPF 0-5 0 - 5   Mucus PRESENT    Hyaline Casts, UA PRESENT     --/--/A POS (04/24 1924)  IMAGING No results found.  MAU Management/MDM: Ordered IV hydration.  We gave a liter of LR and Zofran for nausea.  Added Decadron for nausea Shortly afterward, was able to keep down PO intake Discussed sometimes cycling different meds will work better.  Hopefully she should be improving soon.   ASSESSMENT Single IUP at [redacted]w[redacted]d Nausea and vomiting Mild dehydration Constipation, intermittent  PLAN Discharge home Has nausea meds Rx Miralax for constipation which may help nausea  Pt stable at time of discharge. Encouraged to return here if she develops worsening of  symptoms, increase in pain, fever, or other concerning symptoms.    Hansel Feinstein CNM, MSN Certified Nurse-Midwife 12/22/2020  10:12 PM

## 2020-12-23 MED ORDER — POLYETHYLENE GLYCOL 3350 17 G PO PACK: 17.0000 g | PACK | Freq: Every day | ORAL | 0 refills | Status: DC

## 2020-12-23 NOTE — Progress Notes (Signed)
Marie Williams CNM in earlier to discuss d/c plan. Written and verbal d/c instructions given and understanding voiced. 

## 2020-12-30 ENCOUNTER — Ambulatory Visit: Payer: Medicaid Other | Admitting: *Deleted

## 2020-12-30 ENCOUNTER — Ambulatory Visit: Payer: Medicaid Other | Attending: Obstetrics and Gynecology | Admitting: Genetic Counselor

## 2020-12-30 ENCOUNTER — Other Ambulatory Visit: Payer: Self-pay | Admitting: Family Medicine

## 2020-12-30 ENCOUNTER — Ambulatory Visit (HOSPITAL_BASED_OUTPATIENT_CLINIC_OR_DEPARTMENT_OTHER): Payer: Medicaid Other

## 2020-12-30 ENCOUNTER — Encounter: Payer: Self-pay | Admitting: *Deleted

## 2020-12-30 ENCOUNTER — Other Ambulatory Visit: Payer: Self-pay | Admitting: *Deleted

## 2020-12-30 ENCOUNTER — Other Ambulatory Visit: Payer: Self-pay

## 2020-12-30 VITALS — BP 111/62 | HR 79

## 2020-12-30 DIAGNOSIS — Z363 Encounter for antenatal screening for malformations: Secondary | ICD-10-CM

## 2020-12-30 DIAGNOSIS — Z818 Family history of other mental and behavioral disorders: Secondary | ICD-10-CM

## 2020-12-30 DIAGNOSIS — G40909 Epilepsy, unspecified, not intractable, without status epilepticus: Secondary | ICD-10-CM | POA: Insufficient documentation

## 2020-12-30 DIAGNOSIS — Z3682 Encounter for antenatal screening for nuchal translucency: Secondary | ICD-10-CM | POA: Diagnosis present

## 2020-12-30 DIAGNOSIS — Z31430 Encounter of female for testing for genetic disease carrier status for procreative management: Secondary | ICD-10-CM

## 2020-12-30 DIAGNOSIS — O210 Mild hyperemesis gravidarum: Secondary | ICD-10-CM | POA: Diagnosis not present

## 2020-12-30 DIAGNOSIS — D279 Benign neoplasm of unspecified ovary: Secondary | ICD-10-CM | POA: Diagnosis not present

## 2020-12-30 DIAGNOSIS — O99212 Obesity complicating pregnancy, second trimester: Secondary | ICD-10-CM | POA: Insufficient documentation

## 2020-12-30 DIAGNOSIS — Z8489 Family history of other specified conditions: Secondary | ICD-10-CM | POA: Diagnosis not present

## 2020-12-30 DIAGNOSIS — O99350 Diseases of the nervous system complicating pregnancy, unspecified trimester: Secondary | ICD-10-CM | POA: Insufficient documentation

## 2020-12-30 DIAGNOSIS — O99322 Drug use complicating pregnancy, second trimester: Secondary | ICD-10-CM

## 2020-12-30 DIAGNOSIS — O3482 Maternal care for other abnormalities of pelvic organs, second trimester: Secondary | ICD-10-CM | POA: Insufficient documentation

## 2020-12-30 DIAGNOSIS — F1290 Cannabis use, unspecified, uncomplicated: Secondary | ICD-10-CM | POA: Diagnosis not present

## 2020-12-30 DIAGNOSIS — Z315 Encounter for genetic counseling: Secondary | ICD-10-CM | POA: Insufficient documentation

## 2020-12-30 DIAGNOSIS — Z3A15 15 weeks gestation of pregnancy: Secondary | ICD-10-CM | POA: Diagnosis not present

## 2020-12-30 DIAGNOSIS — Z3689 Encounter for other specified antenatal screening: Secondary | ICD-10-CM

## 2020-12-30 DIAGNOSIS — Z36 Encounter for antenatal screening for chromosomal anomalies: Secondary | ICD-10-CM

## 2020-12-30 NOTE — Progress Notes (Addendum)
ADDENDUM 01/05/21: We received a copy of Gina Holmes's carrier screening results from the [facility]. Gina Holmes's hemoglobin electrophoresis was normal, significantly decreasing her chance of being a carrier for a hemoglobinopathy such as sickle cell disease and thus of having an affected child. Gina Holmes was also negative for the 32 mutations analyzed in the CFTR gene associated with cystic fibrosis (CF). Based on this negative result and her ethnic background, she has a 1 in 164 to 1 in 186 residual risk of being a carrier for CF.   ---------------------------------------------------------------------------------------------------------  12/30/2020  Gina Holmes Jul 09, 2001 MRN: [id number] DOV: 12/30/2020  Gina Holmes presented to the [facility] for a genetics consultation regarding her family history of autism and seizures. Gina Holmes presented to her appointment alone.   Indication for genetic counseling - Family history of autism and seizures   Prenatal history  Gina Holmes is a G93P0010, 20 y.o. female. Her current pregnancy has completed [redacted]w[redacted]d (Estimated Date of Delivery: 06/25/21). Gina Holmes previously had a miscarriage around 8 weeks' gestation.  Gina Holmes denied exposure to environmental toxins or chemical agents. She reported smoking 4 or less cigarettes per day. She also reported using marijuana during the pregnancy, having last smoked about one week ago. She also reported alcohol use before she knew she was pregnant. She has not consumed alcohol since the beginning of April. She denied significant viral illnesses, fevers, and bleeding during the course of her pregnancy. She reported a personal history of asthma, "low white blood cells", bipolar depression, and PTSD. Her medical and surgical histories were otherwise noncontributory.  Prenatal exposure to tobacco can increase the risk for placenta previa and placental abruption,  preterm birth, low birth weight, oral clefts, stillbirth, and sudden infant death syndrome (SIDS). Prenatal exposure to nicotine is also associated with an increased likelihood of neurological disorder and asthma. The risk of pregnancy complications associated with cigarette exposure tends to increase with the amount of cigarettes a woman smokes. It is never too late to quit smoking, and Gina Holmes is encouraged to reduce the number of cigarettes smoked per day with the goal to eventually quit smoking altogether.  We discussed that the effects of marijuana on pregnancies are still largely unknown. It is difficult to study marijuana use during pregnancy for many reasons. While most studies have been reassuring regarding birth defects, others have demonstrated that there may be an increased chance for pregnancy complications, problems with placental function, and temporary postnatal withdrawal symptoms in children exposed to marijuana prenatally. Studies are conflicting regarding the risk for problems such as impaired executive functioning, impulsivity, hyperactivity, aggression, depression, and anxiety in children prenatally exposed to marijuana. For this reason, it is recommended to avoid marijuana use during pregnancy.  Family History  A three generation pedigree was drafted and reviewed. The family history is remarkable for the following:  - Gina Holmes has a paternal half sister with autism and speech delays. See Discussion section for more details.  - Gina Holmes has a maternal half sister who had seizures diagnosed in infancy. Her last seizure reportedly occurred in her teens. See Discussion section for more details.  - Gina Holmes has a nephew who was born with an extra finger (polydactyly). The rest of his developmental and medical history is noncontributory. We discussed that polydactyly is a form of a congenital birth defect. In every pregnancy, a woman starts out with a 3-5% chance of having a  baby with any birth defect.  However, isolated polydactyly in particular can appear to run in families. Thus, Gina Holmes's children could also be at increased risk to have polydactyly in addition to the risk for other birth defects.  - Gina Holmes has maternal aunt who had two children with sickle cell disease (SCD). One child died in their teens from complications of the condition, and the other is still living. SCD is inherited in an autosomal recessive pattern in which both parents must be carriers of the condition to be at risk of having an affected child. We reviewed that Gina Holmes's aunt must be a carrier for SCD since she had affected children. This means that Ms. Squillace herself could have a 25% chance of being a carrier for SCD. Gina Holmes had a sample drawn for a hemoglobin electrophoresis to see if she is a carrier for SCD; however, these results were not available for my review today. We will request a copy of these results from her OBGYN provider to determine if Gina Holmes's children are at increased risk of being affected by SCD.   The remaining family histories were reviewed and found to be noncontributory for birth defects, intellectual disability, recurrent pregnancy loss, and known genetic conditions. Gina Holmes had limited information about portions of her and her partner's family histories; thus, risk assessment was limited.  The patient's ancestry is African American and Hispanic. The father of the pregnancy's ancestry is African American. Ashkenazi Jewish ancestry and consanguinity were denied. Pedigree will be scanned under Media.  Discussion  Family history of autism and seizures:   Gina Holmes was referred for genetic counseling as she has a paternal half sister with autism and a maternal half sister with seizures. Her sister with autism is 75 years old and also had speech delays but is otherwise healthy. Her sister with seizures is now 86 and has not had a seizure since her  teenage years. Her medical and developmental histories are otherwise noncontributory.  We reviewed that autism and seizures can be isolated, multifactorial, or part of a genetic syndrome. Underlying genetic conditions account for less than 10% of autism diagnoses. Recurrence risks for first-degree relatives of individuals with idiopathic autism is estimated to be between 2-8%. Given that Ms. Covel's half sister is a third-degree relative to her fetus, the risk of recurrence would likely not be greatly elevated above the general population risk of ~1 in 84, or 1.5% if her sister's autism does not have a genetic cause. When epilepsy does not have an identified genetic cause, the chance that a first degree relative of someone with epilepsy will also have or develop epilepsy is approximately 2-5%. Given that Ms. Anker's other half sister is a third degree relative to her fetus, recurrence risk for epilepsy may be slightly elevated over the general population risk of 1% if her sister's seizures do not have a genetic cause. Recurrence risks for autism and seizures could be higher in the event that Ms. Garduno's relatives have underlying genetic conditions. Ultimately, Ms. Robidoux was counseled that without knowing the etiology of her sisters' autism and seizures, precise risk assessment is limited.  Aneuploidy screening:  Given that Ms. Taniguchi has not yet had screening for chromosomal aneuploidies during her pregnancy, we discussed noninvasive prenatal screening (NIPS) as an option. Specifically, we discussed that NIPS analyzes cell free DNA originating from the placenta that is found in the maternal blood circulation during pregnancy. This test is not diagnostic for chromosome conditions, but can provide information regarding the presence or  absence of extra DNA for chromosomes 13, 18, 21, and the sex chromosomes. Thus, it would not identify or rule out all fetal aneuploidy. The reported detection rate is  91-99% for trisomies 21, 18, 13, and sex chromosome aneuploidies. The false positive rate is reported to be less than 0.1% for any of these conditions. Ms. Bevilacqua indicated that she is interested in undergoing NIPS.  Of note, NIPS does not screen for open neural tube defects (ONTDs) such as spina bifida. It is recommended that MS-AFP screening be ordered around 16-18 weeks' gestation by her OBGYN provider to assess for this.  Carrier screening:  Per ACOG recommendation, carrier screening for hemoglobinopathies, cystic fibrosis (CF) and spinal muscular atrophy (SMA) was discussed including information about the conditions, rationale for testing, autosomal recessive inheritance, and the option of prenatal diagnosis. Per records, it appears that Ms. Peedin had carrier screening for CF and hemoglobinopathies ordered through her OBGYN provider; however, these results were not available for my review. Given her family history of sickle cell disease, we will request a copy of these results. I additional offered carrier screening for SMA, which Ms. Cauthon accepted at this time. Ms. Tavano was informed that select hemoglobinopathies, CF, and SMA are included on [place]'s newborn screen.   Diagnostic testing:  Ms. Dobbin was also counseled regarding diagnostic testing via amniocentesis available from 16 weeks' gestation. We discussed the technical aspects of the procedure and quoted up to a 1 in 500 (0.2%) risk for spontaneous pregnancy loss or other adverse pregnancy outcomes as a result of amniocentesis. Cultured cells from an amniocentesis sample allow for the visualization of a fetal karyotype, which can detect >99% of large chromosomal aberrations. Chromosomal microarray can also be performed to identify smaller deletions or duplications of fetal chromosomal material. Ms. Scearce informed me that she would likely not consider undergoing amniocentesis unless her NIPS result were to return high-risk  for a chromosomal aneuploidy. She understands that amniocentesis is available at any point after 16 weeks of pregnancy and that she may opt to undergo the procedure at a later date should she change her mind.  Plan:  Ms. Frese had a sample drawn for MaterniT21 NIPS and SMA carrier screening today. Results from NIPS will take approximately 1 week to be returned and results from SMA carrier screening will take 2-3 weeks to be returned. We will call Ms. Kindle as her results become available.  I counseled Ms. Disney regarding the above risks and available options. Second year [employer] genetic counseling student Raj Janus participated in portions of today's session under my supervision. The approximate face-to-face time with the genetic counselor was 45 minutes.  In summary: Reviewed family history concerns Paternal half sister with autism and maternal half sister with seizures Recurrence risks for autism & seizures depend on underlying etiology of features, but could be ~1-1.5% if isolated Reviewed aneuploidy screening options Had sample drawn for MaterniT21 NIPS. We will follow results Discussed carrier screening for cystic fibrosis, spinal muscular atrophy, and hemoglobinopathies May have had carrier screening for CF and hemoglobinopathies (results not available for my review). Will request copy of results given family history of sickle cell disease Had sample drawn for SMA carrier screening. We will follow results Offered additional testing and screening Likely will not consider amniocentesis unless NIPS is high-risk for chromosomal aneuploidy Recommend MS-AFP screening around 16-18 weeks' gestation  Buelah Manis, MS, [facility] Genetic Counselor

## 2021-01-04 ENCOUNTER — Telehealth: Payer: Self-pay | Admitting: Genetic Counselor

## 2021-01-04 NOTE — Telephone Encounter (Signed)
I called Ms. Gina Holmes to discuss her negative noninvasive prenatal screening (NIPS) result. Specifically, Ms. Gina Holmes had MaterniT21 testing through [facility]. These negative results demonstrated an expected representation of chromosome 21, 34, 20, and sex chromosome material, greatly reducing the likelihood of trisomies 59, 70, or 95 and sex chromosome aneuploidies for the pregnancy. Ms. Gina Holmes requested not to know about the expected fetal sex as she is having a gender reveal party. However, she passed along the phone to a relative who I disclosed fetal sex today. The expected fetal sex is female.  NIPS analyzes placental DNA in maternal circulation. NIPS is considered to be highly specific and sensitive, but is not considered to be a diagnostic test. We reviewed that this testing identifies 91-99% of pregnancies with trisomies 24, 24, and 78, as well as sex chromosome aneuploidies. It does not test for all genetic conditions. Diagnostic testing via amniocentesis is available any time after 16 weeks' gestation should Ms. Gina Holmes be interested in confirming this result. She confirmed that she had no questions about these results at this time.  Buelah Manis, MS, [facility] Genetic Counselor

## 2021-01-07 LAB — MATERNIT21 PLUS CORE+SCA
Fetal Fraction: 8
Monosomy X (Turner Syndrome): NOT DETECTED
Result (T21): NEGATIVE
Trisomy 13 (Patau syndrome): NEGATIVE
Trisomy 18 (Edwards syndrome): NEGATIVE
Trisomy 21 (Down syndrome): NEGATIVE
XXX (Triple X Syndrome): NOT DETECTED
XXY (Klinefelter Syndrome): NOT DETECTED
XYY (Jacobs Syndrome): NOT DETECTED

## 2021-01-07 LAB — SMN1 COPY NUMBER ANALYSIS (SMA CARRIER SCREENING)

## 2021-01-11 ENCOUNTER — Telehealth: Payer: Self-pay | Admitting: Obstetrics and Gynecology

## 2021-01-11 NOTE — Telephone Encounter (Signed)
The results of the SMA carrier screening were reviewed with the patient.  SMA is a recessive genetic condition with variable age of onset and severity caused by mutations in the SMN1 gene.  This carrier testing assesses the number of copies of this gene.  Persons with one copy of the SMN1 gene are carriers, and those with no copies are affected with the condition.  Individuals with two or more copies have a reduced chance to be a carrier.  Not all mutations can be detected with this testing, though it can detect 90.3% of carriers in the African American population.  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  Again, this testing cannot eliminate the chance to have a child with SMA, but dramatically reduces the chance.    We encouraged the patient to call with any questions or concerns as they arise.  We may be reached at [phone].  Wilburt Finlay, MS, CGC

## 2021-01-19 ENCOUNTER — Ambulatory Visit: Payer: Self-pay

## 2021-01-19 ENCOUNTER — Other Ambulatory Visit: Payer: Self-pay

## 2021-01-27 ENCOUNTER — Ambulatory Visit: Payer: Medicaid Other

## 2021-01-27 ENCOUNTER — Ambulatory Visit: Payer: Medicaid Other | Attending: Obstetrics and Gynecology

## 2021-02-12 ENCOUNTER — Other Ambulatory Visit: Payer: Self-pay

## 2021-02-12 ENCOUNTER — Inpatient Hospital Stay (HOSPITAL_COMMUNITY)
Admission: AD | Admit: 2021-02-12 | Discharge: 2021-02-13 | Disposition: A | Discharge status: Home / Self Care | Payer: Medicaid Other | Attending: Obstetrics & Gynecology | Admitting: Obstetrics & Gynecology

## 2021-02-12 ENCOUNTER — Encounter (HOSPITAL_COMMUNITY): Payer: Self-pay | Admitting: Obstetrics & Gynecology

## 2021-02-12 DIAGNOSIS — F1290 Cannabis use, unspecified, uncomplicated: Secondary | ICD-10-CM | POA: Diagnosis not present

## 2021-02-12 DIAGNOSIS — R1032 Left lower quadrant pain: Secondary | ICD-10-CM | POA: Insufficient documentation

## 2021-02-12 DIAGNOSIS — R109 Unspecified abdominal pain: Secondary | ICD-10-CM

## 2021-02-12 DIAGNOSIS — O26892 Other specified pregnancy related conditions, second trimester: Secondary | ICD-10-CM | POA: Diagnosis not present

## 2021-02-12 DIAGNOSIS — N949 Unspecified condition associated with female genital organs and menstrual cycle: Secondary | ICD-10-CM | POA: Diagnosis not present

## 2021-02-12 DIAGNOSIS — R102 Pelvic and perineal pain: Secondary | ICD-10-CM | POA: Insufficient documentation

## 2021-02-12 DIAGNOSIS — O99891 Other specified diseases and conditions complicating pregnancy: Secondary | ICD-10-CM | POA: Diagnosis not present

## 2021-02-12 DIAGNOSIS — O99332 Smoking (tobacco) complicating pregnancy, second trimester: Secondary | ICD-10-CM | POA: Insufficient documentation

## 2021-02-12 DIAGNOSIS — O210 Mild hyperemesis gravidarum: Secondary | ICD-10-CM | POA: Diagnosis not present

## 2021-02-12 DIAGNOSIS — Z79899 Other long term (current) drug therapy: Secondary | ICD-10-CM | POA: Diagnosis not present

## 2021-02-12 DIAGNOSIS — F17210 Nicotine dependence, cigarettes, uncomplicated: Secondary | ICD-10-CM | POA: Diagnosis not present

## 2021-02-12 DIAGNOSIS — Z3A21 21 weeks gestation of pregnancy: Secondary | ICD-10-CM | POA: Diagnosis not present

## 2021-02-12 DIAGNOSIS — N898 Other specified noninflammatory disorders of vagina: Secondary | ICD-10-CM

## 2021-02-12 DIAGNOSIS — O99322 Drug use complicating pregnancy, second trimester: Secondary | ICD-10-CM | POA: Diagnosis not present

## 2021-02-12 LAB — WET PREP, GENITAL
Clue Cells Wet Prep HPF POC: NONE SEEN
Sperm: NONE SEEN
Trich, Wet Prep: NONE SEEN
Yeast Wet Prep HPF POC: NONE SEEN

## 2021-02-12 LAB — URINALYSIS, ROUTINE W REFLEX MICROSCOPIC
Bilirubin Urine: NEGATIVE
Glucose, UA: NEGATIVE mg/dL
Hgb urine dipstick: NEGATIVE
Ketones, ur: NEGATIVE mg/dL
Leukocytes,Ua: NEGATIVE
Nitrite: NEGATIVE
Protein, ur: NEGATIVE mg/dL
Specific Gravity, Urine: 1.019 (ref 1.005–1.030)
pH: 7 (ref 5.0–8.0)

## 2021-02-12 NOTE — MAU Note (Signed)
..  Gina Holmes is a 20 y.o. at 35w0dhere in MAU reporting: Aching and sharp left lower abdominal pain all day that sometimes feels like cramps. Vaginal discharge with an odor. Denies vaginal bleeding  Pain score: 7/10 Vitals:   02/12/21 2210  BP: 135/65  Pulse: 90  Resp: 15  Temp: 99 F (37.2 C)  SpO2: 99%     FHT:162 Lab orders placed from triage: UA

## 2021-02-12 NOTE — MAU Provider Note (Signed)
Obstetric Attending MAU Note  Chief Complaint:  Abdominal Pain   Event Date/Time   First Provider Initiated Contact with Patient 02/12/21 2253     HPI: Gina Holmes is a 20 y.o. G2P0010 at 56w0dwho presents to MAU reporting LLQ pain x 1 and foul-smelling vaginal white discharge x 1 week.  Denies any vaginal bleeding, fevers, chills, sweats, dysuria, nausea, vomiting, other GI or GU symptoms or other general symptoms. Denies contractions, leakage of fluid or vaginal bleeding. Good fetal movement.   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   Hyperemesis affecting pregnancy, antepartum 11/24/2020    Past Medical History:  Diagnosis Date   Asthma     OB History  Gravida Para Term Preterm AB Living  2       1    SAB IAB Ectopic Multiple Live Births  1            # Outcome Date GA Lbr Len/2nd Weight Sex Delivery Anes PTL Lv  2 Current           1 SAB             Past Surgical History:  Procedure Laterality Date   NO PAST SURGERIES      Family History: Family History  Problem Relation Age of Onset   Obesity Mother    Obesity Father    Obesity Sister    Cancer Maternal Grandfather     Social History: Social History   Tobacco Use   Smoking status: Every Day    Packs/day: 0.25    Types: Cigarettes   Smokeless tobacco: Never   Tobacco comments:    1 cigarrette per day  Vaping Use   Vaping Use: Never used  Substance Use Topics   Alcohol use: Not Currently   Drug use: Yes    Types: Marijuana    Comment: last use was a week ago    Allergies:  Allergies  Allergen Reactions   Black WDevelopment worker, communityand Shortness Of Breath   [employer]and Shortness Of Breath   Apple Hives    "RED APPLES" itchy throat also   Peach Flavor Hives   Pear Hives    Medications Prior to Admission  Medication Sig Dispense Refill Last Dose   famotidine (PEPCID) 20 MG tablet Take 1 tablet (20 mg  total) by mouth 2 (two) times daily. 60 tablet 1 02/12/2021   Prenatal MV & Min w/FA-DHA (PRENATAL GUMMIES PO) Take by mouth.   02/12/2021   promethazine (PHENERGAN) 25 MG tablet Take 1 tablet (25 mg total) by mouth every 6 (six) hours as needed for nausea or vomiting. 30 tablet 0 02/12/2021   doxylamine, Sleep, (UNISOM) 25 MG tablet Take 1 tablet (25 mg total) by mouth 2 (two) times daily. 60 tablet 0    Doxylamine-Pyridoxine (DICLEGIS) 10-10 MG TBEC Take 1 tablet by mouth at bedtime. 60 tablet 2    escitalopram (LEXAPRO) 10 MG tablet Take 1 tablet by mouth daily. (Patient not taking: Reported on 12/30/2020)      ondansetron (ZOFRAN ODT) 8 MG disintegrating tablet Take 1 tablet (8 mg total) by mouth every 8 (eight) hours as needed for nausea or vomiting. 20 tablet 2    polyethylene glycol (MIRALAX) 17 g packet Take 17 g by mouth daily. 14 each 0     ROS: Pertinent findings in history of present illness.  Physical Exam  Blood  pressure (!) 113/54, pulse 79, temperature 99 F (37.2 C), temperature source Oral, resp. rate 16, height 5' 1.5" (1.562 m), weight 92.9 kg, last menstrual period 09/15/2020, SpO2 98 %. CONSTITUTIONAL: Well-developed, well-nourished female in no acute distress.  NECK: Normal range of motion, supple, no masses SKIN: Skin is warm and dry. No rash noted. Not diaphoretic. No erythema. No pallor. [place]: Alert and oriented to person, place, and time. Normal reflexes, muscle tone coordination. No cranial nerve deficit noted. PSYCHIATRIC: Normal mood and affect. Normal behavior. Normal judgment and thought content. CARDIOVASCULAR: Normal heart rate noted, regular rhythm RESPIRATORY: Effort and breath sounds normal, no problems with respiration noted ABDOMEN: Soft, mild LLQ tenderness,no rebound, no guarding, nondistended, gravid appropriate for gestational age MUSCULOSKELETAL: Normal range of motion. No edema and no tenderness. 2+ distal pulses.  PELVIC EXAM: NEFG, scant white  discharge, no blood, cervix clean Dilation: Closed Effacement (%): Thick Station:  (long) Exam by:: dr Deserai Cansler  FHR:  Baseline 162 bpm    Labs: Results for orders placed or performed during the hospital encounter of 02/12/21 (from the past 24 hour(s))  Urinalysis, Routine w reflex microscopic Urine, Clean Catch     Status: Abnormal   Collection Time: 02/12/21 10:32 PM  Result Value Ref Range   Color, Urine YELLOW YELLOW   APPearance HAZY (A) CLEAR   Specific Gravity, Urine 1.019 1.005 - 1.030   pH 7.0 5.0 - 8.0   Glucose, UA NEGATIVE NEGATIVE mg/dL   Hgb urine dipstick NEGATIVE NEGATIVE   Bilirubin Urine NEGATIVE NEGATIVE   Ketones, ur NEGATIVE NEGATIVE mg/dL   Protein, ur NEGATIVE NEGATIVE mg/dL   Nitrite NEGATIVE NEGATIVE   Leukocytes,Ua NEGATIVE NEGATIVE  Wet prep, genital     Status: Abnormal   Collection Time: 02/12/21 10:59 PM   Specimen: Vaginal  Result Value Ref Range   Yeast Wet Prep HPF POC NONE SEEN NONE SEEN   Trich, Wet Prep NONE SEEN NONE SEEN   Clue Cells Wet Prep HPF POC NONE SEEN NONE SEEN   WBC, Wet Prep HPF POC FEW (A) NONE SEEN   Sperm NONE SEEN    MAU Course: UA, wet prep and GC/Chlam done UA and wet prep negative. GC/Chlam pending.  Assessment: 1. Left round ligament pain   2. [redacted] weeks gestation of pregnancy   3. Vaginal discharge during pregnancy in second trimester     Plan: Will follow up pending cultures, negative UA and wet prep. Advised to use Tylenol as needed for pain. Routine obsteric precautions reviewed Follow up with OB provider Discharged to home   Allergies as of 02/12/2021       Reactions   [employer], Shortness Of Breath   [employer], Shortness Of Breath   Apple Hives   "RED APPLES" itchy throat also   Peach Flavor Hives   Pear Hives        Medication List     STOP taking these medications    escitalopram 10 MG tablet Commonly known as: LEXAPRO       TAKE these medications     Doxylamine-Pyridoxine 10-10 MG Tbec Commonly known as: Diclegis Take 1 tablet by mouth at bedtime.   famotidine 20 MG tablet Commonly known as: PEPCID Take 1 tablet (20 mg total) by mouth 2 (two) times daily.   ondansetron 8 MG disintegrating tablet Commonly known as: Zofran ODT Take 1 tablet (8 mg total) by mouth every 8 (eight) hours as needed for nausea or vomiting.  polyethylene glycol 17 g packet Commonly known as: MiraLax Take 17 g by mouth daily.   PRENATAL GUMMIES PO Take by mouth.   promethazine 25 MG tablet Commonly known as: PHENERGAN Take 1 tablet (25 mg total) by mouth every 6 (six) hours as needed for nausea or vomiting.   SM Sleep Aid 25 MG tablet Generic drug: doxylamine (Sleep) Take 1 tablet (25 mg total) by mouth 2 (two) times daily.        Osborne Oman, MD 02/12/2021 11:58 PM

## 2021-02-14 LAB — GC/CHLAMYDIA PROBE AMP (~~LOC~~) NOT AT ARMC
Chlamydia: NEGATIVE
Comment: NEGATIVE
Comment: NORMAL
Neisseria Gonorrhea: NEGATIVE

## 2021-02-22 ENCOUNTER — Ambulatory Visit: Payer: Medicaid Other | Attending: Obstetrics and Gynecology

## 2021-02-22 ENCOUNTER — Ambulatory Visit: Payer: Medicaid Other

## 2021-03-16 ENCOUNTER — Encounter (HOSPITAL_COMMUNITY): Payer: Self-pay | Admitting: Obstetrics and Gynecology

## 2021-03-16 ENCOUNTER — Inpatient Hospital Stay (HOSPITAL_COMMUNITY)
Admission: AD | Admit: 2021-03-16 | Discharge: 2021-03-16 | Disposition: A | Discharge status: Home / Self Care | Payer: Medicaid Other | Attending: Obstetrics and Gynecology | Admitting: Obstetrics and Gynecology

## 2021-03-16 ENCOUNTER — Other Ambulatory Visit: Payer: Self-pay

## 2021-03-16 DIAGNOSIS — M545 Low back pain, unspecified: Secondary | ICD-10-CM | POA: Diagnosis not present

## 2021-03-16 DIAGNOSIS — Z79899 Other long term (current) drug therapy: Secondary | ICD-10-CM | POA: Diagnosis not present

## 2021-03-16 DIAGNOSIS — R197 Diarrhea, unspecified: Secondary | ICD-10-CM | POA: Insufficient documentation

## 2021-03-16 DIAGNOSIS — R1084 Generalized abdominal pain: Secondary | ICD-10-CM

## 2021-03-16 DIAGNOSIS — Z3A25 25 weeks gestation of pregnancy: Secondary | ICD-10-CM | POA: Diagnosis not present

## 2021-03-16 DIAGNOSIS — M549 Dorsalgia, unspecified: Secondary | ICD-10-CM

## 2021-03-16 DIAGNOSIS — O99891 Other specified diseases and conditions complicating pregnancy: Secondary | ICD-10-CM

## 2021-03-16 DIAGNOSIS — O26892 Other specified pregnancy related conditions, second trimester: Secondary | ICD-10-CM

## 2021-03-16 LAB — URINALYSIS, ROUTINE W REFLEX MICROSCOPIC
Bilirubin Urine: NEGATIVE
Glucose, UA: NEGATIVE mg/dL
Hgb urine dipstick: NEGATIVE
Ketones, ur: NEGATIVE mg/dL
Leukocytes,Ua: NEGATIVE
Nitrite: NEGATIVE
Protein, ur: NEGATIVE mg/dL
Specific Gravity, Urine: 1.011 (ref 1.005–1.030)
pH: 6 (ref 5.0–8.0)

## 2021-03-16 LAB — CBC
HCT: 31.3 % — ABNORMAL LOW (ref 36.0–46.0)
Hemoglobin: 10.5 g/dL — ABNORMAL LOW (ref 12.0–15.0)
MCH: 27.4 pg (ref 26.0–34.0)
MCHC: 33.5 g/dL (ref 30.0–36.0)
MCV: 81.7 fL (ref 80.0–100.0)
Platelets: 239 10*3/uL (ref 150–400)
RBC: 3.83 MIL/uL — ABNORMAL LOW (ref 3.87–5.11)
RDW: 12.7 % (ref 11.5–15.5)
WBC: 11.3 10*3/uL — ABNORMAL HIGH (ref 4.0–10.5)
nRBC: 0 % (ref 0.0–0.2)

## 2021-03-16 LAB — LIPASE, BLOOD: Lipase: 28 U/L (ref 11–51)

## 2021-03-16 LAB — COMPREHENSIVE METABOLIC PANEL
ALT: 13 U/L (ref 0–44)
AST: 16 U/L (ref 15–41)
Albumin: 2.7 g/dL — ABNORMAL LOW (ref 3.5–5.0)
Alkaline Phosphatase: 50 U/L (ref 38–126)
Anion gap: 7 (ref 5–15)
BUN: <5 mg/dL — ABNORMAL LOW (ref 6–20)
CO2: 23 mmol/L (ref 22–32)
Calcium: 8.8 mg/dL — ABNORMAL LOW (ref 8.9–10.3)
Chloride: 105 mmol/L (ref 98–111)
Creatinine, Ser: 0.44 mg/dL (ref 0.44–1.00)
GFR, Estimated: >60 mL/min (ref >60)
Glucose, Bld: 90 mg/dL (ref 70–99)
Potassium: 3.5 mmol/L (ref 3.5–5.1)
Sodium: 135 mmol/L (ref 135–145)
Total Bilirubin: 0.1 mg/dL — ABNORMAL LOW (ref 0.3–1.2)
Total Protein: 5.8 g/dL — ABNORMAL LOW (ref 6.5–8.1)

## 2021-03-16 MED ORDER — ACETAMINOPHEN 500 MG PO TABS
1000.0000 mg | ORAL_TABLET | Freq: Once | ORAL | Status: AC
  Administered 2021-03-16: 1000 mg via ORAL
  Filled 2021-03-16: qty 2

## 2021-03-16 MED ORDER — METOCLOPRAMIDE HCL 10 MG PO TABS
10.0000 mg | ORAL_TABLET | Freq: Once | ORAL | Status: AC
  Administered 2021-03-16: 10 mg via ORAL
  Filled 2021-03-16: qty 1

## 2021-03-16 MED ORDER — IBUPROFEN 600 MG PO TABS
600.0000 mg | ORAL_TABLET | Freq: Once | ORAL | Status: AC
  Administered 2021-03-16: 600 mg via ORAL
  Filled 2021-03-16: qty 1

## 2021-03-16 NOTE — MAU Provider Note (Addendum)
History    CSN: [id number]  Arrival date and time: 03/16/21 1537   Event Date/Time   First Provider Initiated Contact with Patient 03/16/21 1621      Chief Complaint  Patient presents with   Back Pain    Gina Holmes is a 20 y.o. female G2P0010 @ 68w4dwith PMH miscarriage, presents with acute onset back pain and abdominal pain. Around 5 am she felt pain in her lower back that spread around to her abdomen this afternoon. The pain is generalized and she describes it as 7/10 aching. It has been constant. She has not tried anything for the pain and does not notice anything that makes it worse. The back pain does not radiate down her legs. Denies nausea but had one episode of watery emesis this morning, and has had some loose stool every now and then over the past few days.  Denies fever, headache, vision changes, chest pain, shob, vaginal sxs, urinary sxs, bladder/bowel dysfunction. Denies contractions, leakage of fluids.  OB History     Gravida  2   Para      Term      Preterm      AB  1   Living         SAB  1   IAB      Ectopic      Multiple      Live Births              Past Medical History:  Diagnosis Date   Asthma     Past Surgical History:  Procedure Laterality Date   NO PAST SURGERIES      Family History  Problem Relation Age of Onset   Obesity Mother    Obesity Father    Obesity Sister    Cancer Maternal Grandfather     Social History   Tobacco Use   Smoking status: Every Day    Packs/day: 0.25    Types: Cigarettes   Smokeless tobacco: Never   Tobacco comments:    1 cigarrette per day  Vaping Use   Vaping Use: Never used  Substance Use Topics   Alcohol use: Not Currently   Drug use: Not Currently    Types: Marijuana    Comment: last use one month ago    Allergies:  Allergies  Allergen Reactions   Black WDevelopment worker, communityand Shortness Of Breath   [employer]and Shortness Of Breath   Apple Hives    "RED APPLES" itchy  throat also   Peach Flavor Hives   Pear Hives    Medications Prior to Admission  Medication Sig Dispense Refill Last Dose   Prenatal MV & Min w/FA-DHA (PRENATAL GUMMIES PO) Take by mouth.   03/16/2021   valACYclovir (VALTREX) 500 MG tablet Take 500 mg by mouth 2 (two) times daily.   03/16/2021   doxylamine, Sleep, (UNISOM) 25 MG tablet Take 1 tablet (25 mg total) by mouth 2 (two) times daily. 60 tablet 0    Doxylamine-Pyridoxine (DICLEGIS) 10-10 MG TBEC Take 1 tablet by mouth at bedtime. 60 tablet 2    famotidine (PEPCID) 20 MG tablet Take 1 tablet (20 mg total) by mouth 2 (two) times daily. 60 tablet 1    ondansetron (ZOFRAN ODT) 8 MG disintegrating tablet Take 1 tablet (8 mg total) by mouth every 8 (eight) hours as needed for nausea or vomiting. 20 tablet 2    polyethylene glycol (MIRALAX) 17 g packet Take 17 g by mouth  daily. 14 each 0    Prenatal Vit-Fe Fumarate-FA (PRENATAL VITAMIN PLUS LOW IRON) 27-1 MG TABS Take 1 tablet by mouth daily.      promethazine (PHENERGAN) 25 MG tablet Take 1 tablet (25 mg total) by mouth every 6 (six) hours as needed for nausea or vomiting. 30 tablet 0     Review of Systems  Constitutional: Negative.  Negative for chills and fever.  HENT: Negative.    Eyes: Negative.   Respiratory: Negative.  Negative for shortness of breath.   Cardiovascular: Negative.  Negative for chest pain.  Gastrointestinal:  Positive for abdominal pain and vomiting. Negative for blood in stool.       1 episode of emesis today  Endocrine: Negative.   Genitourinary:  Positive for flank pain. Negative for dysuria, hematuria, vaginal bleeding, vaginal discharge and vaginal pain.  Musculoskeletal:  Positive for back pain. Negative for neck pain and neck stiffness.  Skin: Negative.   Allergic/Immunologic: Negative.   Neurological: Negative.  Negative for dizziness and headaches.  Hematological: Negative.   Psychiatric/Behavioral:  Positive for sleep disturbance.   All other systems  reviewed and are negative.  Physical Exam   Blood pressure 136/68, pulse 80, temperature 98.3 F (36.8 C), resp. rate 12, last menstrual period 09/15/2020, SpO2 98 %.  Physical Exam Vitals and nursing note reviewed. Exam conducted with a chaperone present.  Constitutional:      Appearance: She is not ill-appearing.  HENT:     Head: Normocephalic and atraumatic.  Cardiovascular:     Pulses: Normal pulses.  Pulmonary:     Effort: Pulmonary effort is normal. No respiratory distress.  Abdominal:     Tenderness: There is abdominal tenderness.     Comments: Generalized  Genitourinary:    Vagina: No vaginal discharge or bleeding.     Cervix: Normal.  Musculoskeletal:        General: Normal range of motion.     Cervical back: Normal range of motion and neck supple. No rigidity.     Lumbar back: Tenderness present.  Skin:    General: Skin is warm and dry.  Neurological:     Mental Status: She is alert and oriented to person, place, and time.  Psychiatric:        Mood and Affect: Mood normal.     MAU Course  Procedures  MDM  Fetal heart tones normal Cervix not dilated Trial of tylenol with Reglan for pain control Will consider U/S if no sx relief CBC, CMP, UA, lipase pending  UA neg Lipase normal @ 28, bilirubin 0.1 Assessment and Plan   Generalized abdominal pain Low back pain [redacted] weeks gestation of pregnancy  Pain control with tylenol, discharge with strict return precautions. Consider outpatient abdominal U/S  Les Pou 03/16/2021, 5:10 PM     PA student attestation:  I have seen and examined this patient and agree with above documentation in the PA's note.   Gina Holmes is a 20 y.o. G73P0010 female reporting upper back pain, lower back pain and lower abdominal pain. She has a history of cannabis hyperemesis. She reports nausea, and one episode of vomiting.  The pain started this morning at 5:00 am. The pain started in her back and radiates around to her  abdomen.  She rates her pain 7/10. The pain comes and goes.   Associated symptoms:  Negative fever and chills + abdominal pain Negative  vaginal bleeding Negative vaginal discharge Negative urinary complaints Negative GI complaints  PE: Patient Vitals  for the past 24 hrs:  BP Temp Pulse Resp SpO2  03/16/21 2012 (!) 123/49 -- 73 -- --  03/16/21 1558 136/68 -- 80 -- --  03/16/21 1554 -- 98.3 F (36.8 C) -- 12 98 %   Gen: calm comfortable, NAD Resp: normal effort, no distress Heart: Regular rate Abd: Soft, generalized tenderness throughout abdomen. No CVA tenderness.  Neuro: A&O x 4 Dilation: Closed Effacement (%): Thick Cervical Position: Posterior Station: Ballotable Exam by:: Gina Guild NP   ROS, labs, PMH reviewed  Orders Placed This Encounter  Procedures   Urinalysis, Routine w reflex microscopic Urine, Clean Catch   CBC   Comprehensive metabolic panel   Lipase, blood   Discharge patient   Meds ordered this encounter  Medications   acetaminophen (TYLENOL) tablet 1,000 mg   metoCLOPramide (REGLAN) tablet 10 mg   ibuprofen (ADVIL) tablet 600 mg    MDM  Fetal heart tones normal Cervix not dilated Trial of tylenol with Reglan for pain control Will consider U/S if no sx relief CBC, CMP, UA, lipase pending  Patient sleeping in bed upon arrival. Patient requesting [place] tonight. Some relief with tylenol, will add a dose of ibuprofen. Patient ok for DC home.   Assessment 1. Back pain in pregnancy   2. Generalized abdominal pain   3. [redacted] weeks gestation of pregnancy     Plan: - Discharge home in stable condition. - Return If symptoms worsen - Follow-up as scheduled at your doctor's office or sooner as needed if symptoms worsen. - Return to maternity admissions symptoms worsen  Gina Holmes, Artist Pais, NP 03/17/2021 2:32 PM

## 2021-03-16 NOTE — MAU Note (Signed)
Pt reports back since 0500 and is now starting to wrap around to the front.   Pt reports pressure in her back.

## 2021-03-22 ENCOUNTER — Other Ambulatory Visit: Payer: Self-pay | Admitting: *Deleted

## 2021-03-22 ENCOUNTER — Other Ambulatory Visit: Payer: Self-pay

## 2021-03-22 ENCOUNTER — Ambulatory Visit: Payer: Medicaid Other | Attending: Obstetrics and Gynecology

## 2021-03-22 ENCOUNTER — Ambulatory Visit: Payer: Medicaid Other | Admitting: *Deleted

## 2021-03-22 ENCOUNTER — Encounter: Payer: Self-pay | Admitting: *Deleted

## 2021-03-22 VITALS — BP 121/61 | HR 86

## 2021-03-22 DIAGNOSIS — Z6835 Body mass index (BMI) 35.0-35.9, adult: Secondary | ICD-10-CM | POA: Diagnosis present

## 2021-03-22 DIAGNOSIS — Z6841 Body Mass Index (BMI) 40.0 and over, adult: Secondary | ICD-10-CM

## 2021-03-22 DIAGNOSIS — E669 Obesity, unspecified: Secondary | ICD-10-CM | POA: Diagnosis not present

## 2021-03-22 DIAGNOSIS — O99322 Drug use complicating pregnancy, second trimester: Secondary | ICD-10-CM

## 2021-03-22 DIAGNOSIS — Z3A26 26 weeks gestation of pregnancy: Secondary | ICD-10-CM

## 2021-03-22 DIAGNOSIS — Z363 Encounter for antenatal screening for malformations: Secondary | ICD-10-CM | POA: Insufficient documentation

## 2021-03-22 DIAGNOSIS — F1910 Other psychoactive substance abuse, uncomplicated: Secondary | ICD-10-CM | POA: Diagnosis not present

## 2021-03-22 DIAGNOSIS — O99212 Obesity complicating pregnancy, second trimester: Secondary | ICD-10-CM | POA: Diagnosis not present

## 2021-05-03 ENCOUNTER — Other Ambulatory Visit: Payer: Self-pay

## 2021-05-03 ENCOUNTER — Encounter: Payer: Self-pay | Admitting: *Deleted

## 2021-05-03 ENCOUNTER — Ambulatory Visit: Payer: Medicaid Other | Attending: Obstetrics

## 2021-05-03 ENCOUNTER — Other Ambulatory Visit: Payer: Self-pay | Admitting: *Deleted

## 2021-05-03 ENCOUNTER — Ambulatory Visit: Payer: Medicaid Other | Admitting: *Deleted

## 2021-05-03 VITALS — BP 119/53 | HR 84

## 2021-05-03 DIAGNOSIS — D279 Benign neoplasm of unspecified ovary: Secondary | ICD-10-CM | POA: Insufficient documentation

## 2021-05-03 DIAGNOSIS — O99213 Obesity complicating pregnancy, third trimester: Secondary | ICD-10-CM | POA: Insufficient documentation

## 2021-05-03 DIAGNOSIS — Z6841 Body Mass Index (BMI) 40.0 and over, adult: Secondary | ICD-10-CM | POA: Diagnosis present

## 2021-05-03 DIAGNOSIS — E669 Obesity, unspecified: Secondary | ICD-10-CM | POA: Diagnosis not present

## 2021-05-03 DIAGNOSIS — O99323 Drug use complicating pregnancy, third trimester: Secondary | ICD-10-CM | POA: Diagnosis not present

## 2021-05-03 DIAGNOSIS — Z3A32 32 weeks gestation of pregnancy: Secondary | ICD-10-CM | POA: Insufficient documentation

## 2021-05-03 DIAGNOSIS — O99333 Smoking (tobacco) complicating pregnancy, third trimester: Secondary | ICD-10-CM | POA: Insufficient documentation

## 2021-05-03 DIAGNOSIS — O3480 Maternal care for other abnormalities of pelvic organs, unspecified trimester: Secondary | ICD-10-CM | POA: Insufficient documentation

## 2021-05-03 DIAGNOSIS — Z6835 Body mass index (BMI) 35.0-35.9, adult: Secondary | ICD-10-CM | POA: Diagnosis present

## 2021-05-03 DIAGNOSIS — O3483 Maternal care for other abnormalities of pelvic organs, third trimester: Secondary | ICD-10-CM

## 2021-05-03 DIAGNOSIS — D259 Leiomyoma of uterus, unspecified: Secondary | ICD-10-CM

## 2021-05-20 ENCOUNTER — Inpatient Hospital Stay (HOSPITAL_COMMUNITY)
Admission: AD | Admit: 2021-05-20 | Discharge: 2021-05-20 | Disposition: A | Discharge status: Home / Self Care | Payer: Medicaid Other | Attending: Obstetrics & Gynecology | Admitting: Obstetrics & Gynecology

## 2021-05-20 DIAGNOSIS — F17210 Nicotine dependence, cigarettes, uncomplicated: Secondary | ICD-10-CM | POA: Diagnosis not present

## 2021-05-20 DIAGNOSIS — O26893 Other specified pregnancy related conditions, third trimester: Secondary | ICD-10-CM | POA: Diagnosis present

## 2021-05-20 DIAGNOSIS — O99333 Smoking (tobacco) complicating pregnancy, third trimester: Secondary | ICD-10-CM | POA: Insufficient documentation

## 2021-05-20 DIAGNOSIS — R102 Pelvic and perineal pain: Secondary | ICD-10-CM | POA: Diagnosis not present

## 2021-05-20 DIAGNOSIS — R109 Unspecified abdominal pain: Secondary | ICD-10-CM | POA: Diagnosis not present

## 2021-05-20 DIAGNOSIS — O99891 Other specified diseases and conditions complicating pregnancy: Secondary | ICD-10-CM

## 2021-05-20 DIAGNOSIS — Z3A34 34 weeks gestation of pregnancy: Secondary | ICD-10-CM | POA: Insufficient documentation

## 2021-05-20 LAB — URINALYSIS, ROUTINE W REFLEX MICROSCOPIC
Bilirubin Urine: NEGATIVE
Glucose, UA: NEGATIVE mg/dL
Hgb urine dipstick: NEGATIVE
Ketones, ur: NEGATIVE mg/dL
Leukocytes,Ua: NEGATIVE
Nitrite: NEGATIVE
Protein, ur: NEGATIVE mg/dL
Specific Gravity, Urine: 1.018 (ref 1.005–1.030)
pH: 6 (ref 5.0–8.0)

## 2021-05-20 LAB — WET PREP, GENITAL
Clue Cells Wet Prep HPF POC: NONE SEEN
Sperm: NONE SEEN
Trich, Wet Prep: NONE SEEN
Yeast Wet Prep HPF POC: NONE SEEN

## 2021-05-20 MED ORDER — SODIUM CHLORIDE 0.9 % IV SOLN: 12.5000 mg | Freq: Once | INTRAVENOUS | Status: DC

## 2021-05-20 MED ORDER — LACTATED RINGERS IV BOLUS: 1000.0000 mL | Freq: Once | INTRAVENOUS | Status: DC

## 2021-05-20 NOTE — MAU Note (Signed)
..  Gina Holmes is a 20 y.o. at [redacted]w[redacted]d here in MAU reporting: Nonstop pressure and "shocks in vagina" since yesterday morning. Denies vaginal bleeding or leaking of fluid. +FM. Reports some contractions earlier today, none in the last couple of hours.   Pain score: 7/10 Vitals:   05/20/21 2110  BP: 121/60  Pulse: 87  Resp: 16  Temp: 98.8 F (37.1 C)  SpO2: 99%     FHT:132 Lab orders placed from triage: UA

## 2021-05-20 NOTE — Discharge Instructions (Signed)
Reasons to return to MAU at [facility]:  Since you are preterm, return to MAU if:  1.  Contractions are 10 minutes apart or less and they becoming more uncomfortable or painful over time 2.  You have a large gush of fluid, or a trickle of fluid that will not stop and you have to wear a pad 3.  You have bleeding that is bright red, heavier than spotting--like menstrual bleeding (spotting can be normal in early labor or after a check of your cervix) 4.  You do not feel the baby moving like he/she normally does  

## 2021-05-20 NOTE — MAU Provider Note (Signed)
Chief Complaint:  Pelvic Pain   Event Date/Time   First Provider Initiated Contact with Patient 05/20/21 2128      HPI: Gina Holmes is a 20 y.o. G2P0010 at [redacted]w[redacted]d who presents to maternity admissions reporting sharp shooting pains in the vagina today. She also reports irregular contractions, more yesterday than today.   She reports good fetal movement, denies LOF, vaginal bleeding, vaginal itching/burning, urinary symptoms, h/a, dizziness, n/v, or fever/chills.     Location: vagina Quality: sharp, shooting Severity: 9/10 on pain scale Duration: 1 day Timing: intermittent Modifying factors: none Associated signs and symptoms: abdominal cramping  HPI  Past Medical History: Past Medical History:  Diagnosis Date   Asthma     Past obstetric history: OB History  Gravida Para Term Preterm AB Living  2       1    SAB IAB Ectopic Multiple Live Births  1            # Outcome Date GA Lbr Len/2nd Weight Sex Delivery Anes PTL Lv  2 Current           1 SAB             Past Surgical History: Past Surgical History:  Procedure Laterality Date   NO PAST SURGERIES      Family History: Family History  Problem Relation Age of Onset   Obesity Mother    Obesity Father    Obesity Sister    Cancer Maternal Grandfather     Social History: Social History   Tobacco Use   Smoking status: Every Day    Packs/day: 0.25    Types: Cigarettes   Smokeless tobacco: Never   Tobacco comments:    1 cigarrette per day  Vaping Use   Vaping Use: Never used  Substance Use Topics   Alcohol use: Not Currently   Drug use: Not Currently    Types: Marijuana    Comment: last use one month ago    Allergies:  Allergies  Allergen Reactions   Black Development worker, community and Shortness Of Breath   [employer] and Shortness Of Breath   Apple Hives    "RED APPLES" itchy throat also   Peach Flavor Hives   Pear Hives    Meds:  Medications Prior to Admission  Medication Sig Dispense Refill  Last Dose   doxylamine, Sleep, (UNISOM) 25 MG tablet Take 1 tablet (25 mg total) by mouth 2 (two) times daily. 60 tablet 0    Doxylamine-Pyridoxine (DICLEGIS) 10-10 MG TBEC Take 1 tablet by mouth at bedtime. 60 tablet 2    famotidine (PEPCID) 20 MG tablet Take 1 tablet (20 mg total) by mouth 2 (two) times daily. 60 tablet 1    ondansetron (ZOFRAN ODT) 8 MG disintegrating tablet Take 1 tablet (8 mg total) by mouth every 8 (eight) hours as needed for nausea or vomiting. 20 tablet 2    polyethylene glycol (MIRALAX) 17 g packet Take 17 g by mouth daily. 14 each 0    Prenatal MV & Min w/FA-DHA (PRENATAL GUMMIES PO) Take by mouth.      Prenatal Vit-Fe Fumarate-FA (PRENATAL VITAMIN PLUS LOW IRON) 27-1 MG TABS Take 1 tablet by mouth daily.      promethazine (PHENERGAN) 25 MG tablet Take 1 tablet (25 mg total) by mouth every 6 (six) hours as needed for nausea or vomiting. 30 tablet 0    valACYclovir (VALTREX) 500 MG tablet Take 500 mg by mouth 2 (two) times daily.  ROS:  Review of Systems  Constitutional:  Negative for chills, fatigue and fever.  Eyes:  Negative for visual disturbance.  Respiratory:  Negative for shortness of breath.   Cardiovascular:  Negative for chest pain.  Gastrointestinal:  Positive for abdominal pain. Negative for nausea and vomiting.  Genitourinary:  Positive for vaginal pain. Negative for difficulty urinating, dysuria, flank pain, pelvic pain, vaginal bleeding and vaginal discharge.  Neurological:  Negative for dizziness and headaches.  Psychiatric/Behavioral: Negative.      I have reviewed patient's Past Medical Hx, Surgical Hx, Family Hx, Social Hx, medications and allergies.   Physical Exam  Patient Vitals for the past 24 hrs:  BP Temp Temp src Pulse Resp SpO2 Height Weight  05/20/21 2155 127/65 -- -- 92 -- -- -- --  05/20/21 2110 121/60 98.8 F (37.1 C) Oral 87 16 99 % 5\' 1"  (1.549 m) 97.8 kg   Constitutional: Well-developed, well-nourished female in no  acute distress.  Cardiovascular: normal rate Respiratory: normal effort GI: Abd soft, non-tender, gravid appropriate for gestational age.  MS: Extremities nontender, no edema, normal ROM Neurologic: Alert and oriented x 4.  GU: Neg CVAT.  PELVIC EXAM: Cervix pink, visually closed, without lesion, scant white creamy discharge, vaginal walls and external genitalia normal Bimanual exam: Cervix 0/long/high, firm, anterior, neg CMT, uterus nontender, nonenlarged, adnexa without tenderness, enlargement, or mass  Dilation: Closed Effacement (%): Thick Exam by:: Blanch Media, CNM  FHT:  Baseline 145 , moderate variability, accelerations present, no decelerations Contractions: none on toco or to palpation   Labs: Results for orders placed or performed during the hospital encounter of 05/20/21 (from the past 24 hour(s))  Urinalysis, Routine w reflex microscopic Urine, Clean Catch     Status: Abnormal   Collection Time: 05/20/21  9:20 PM  Result Value Ref Range   Color, Urine YELLOW YELLOW   APPearance HAZY (A) CLEAR   Specific Gravity, Urine 1.018 1.005 - 1.030   pH 6.0 5.0 - 8.0   Glucose, UA NEGATIVE NEGATIVE mg/dL   Hgb urine dipstick NEGATIVE NEGATIVE   Bilirubin Urine NEGATIVE NEGATIVE   Ketones, ur NEGATIVE NEGATIVE mg/dL   Protein, ur NEGATIVE NEGATIVE mg/dL   Nitrite NEGATIVE NEGATIVE   Leukocytes,Ua NEGATIVE NEGATIVE   --/--/A POS (04/24 1924)  Imaging:   MAU Course/MDM: Orders Placed This Encounter  Procedures   Wet prep, genital   Urinalysis, Routine w reflex microscopic Urine, Clean Catch   Discharge patient    Meds ordered this encounter  Medications   DISCONTD: lactated ringers bolus 1,000 mL   DISCONTD: promethazine (PHENERGAN) 12.5 mg in sodium chloride 0.9 % 50 mL IVPB     NST reviewed and reactive Cervix closed/thick/high, no evidence of preterm labor Wet prep/GC pending May be related to fetal position, encouraged maternal positions including  hands and knees to encourage fetal rotation, maternal comfort Keep scheduled prenatal appts Pt discharge with strict preterm labor precautions.    Assessment: 1. Vaginal pain   2. Abdominal pain in pregnancy, third trimester   3. [redacted] weeks gestation of pregnancy     Plan: Discharge home Labor precautions and fetal kick counts  Follow-up Information     Department, [facility] Follow up.   Why: As scheduled Contact information: [place] [address] [phone]         [facility] Follow up.   Specialty: Obstetrics and Gynecology Why: As needed for emergencies Contact information: [address]  [phone] [place] [address] [phone]               Allergies as of 05/20/2021       Reactions   [employer], Shortness Of Breath   [employer], Shortness Of Breath   Apple Hives   "RED APPLES" itchy throat also   Peach Flavor Hives   Pear Hives        Medication List     TAKE these medications    Doxylamine-Pyridoxine 10-10 MG Tbec Commonly known as: Diclegis Take 1 tablet by mouth at bedtime.   famotidine 20 MG tablet Commonly known as: PEPCID Take 1 tablet (20 mg total) by mouth 2 (two) times daily.   ondansetron 8 MG disintegrating tablet Commonly known as: Zofran ODT Take 1 tablet (8 mg total) by mouth every 8 (eight) hours as needed for nausea or vomiting.   polyethylene glycol 17 g packet Commonly known as: MiraLax Take 17 g by mouth daily.   PRENATAL GUMMIES PO Take by mouth.   Prenatal Vitamin Plus Low Iron 27-1 MG Tabs Take 1 tablet by mouth daily.   promethazine 25 MG tablet Commonly known as: PHENERGAN Take 1 tablet (25 mg total) by mouth every 6 (six) hours as needed for nausea or vomiting.   SM Sleep Aid 25 MG tablet Generic drug: doxylamine (Sleep) Take 1 tablet (25 mg total) by mouth 2 (two) times daily.   valACYclovir 500 MG  tablet Commonly known as: VALTREX Take 500 mg by mouth 2 (two) times daily.        Fatima Blank Certified Nurse-Midwife 05/20/2021 10:06 PM

## 2021-05-23 LAB — GC/CHLAMYDIA PROBE AMP (~~LOC~~) NOT AT ARMC
Chlamydia: NEGATIVE
Comment: NEGATIVE
Comment: NORMAL
Neisseria Gonorrhea: NEGATIVE

## 2021-05-24 ENCOUNTER — Other Ambulatory Visit: Payer: Self-pay

## 2021-05-24 ENCOUNTER — Encounter (HOSPITAL_COMMUNITY): Payer: Self-pay | Admitting: Emergency Medicine

## 2021-05-24 ENCOUNTER — Inpatient Hospital Stay (HOSPITAL_COMMUNITY)
Admission: EM | Admit: 2021-05-24 | Discharge: 2021-05-24 | Disposition: A | Discharge status: Home / Self Care | Payer: Medicaid Other | Attending: Obstetrics and Gynecology | Admitting: Obstetrics and Gynecology

## 2021-05-24 ENCOUNTER — Inpatient Hospital Stay (HOSPITAL_COMMUNITY): Payer: Medicaid Other

## 2021-05-24 DIAGNOSIS — Z369 Encounter for antenatal screening, unspecified: Secondary | ICD-10-CM | POA: Insufficient documentation

## 2021-05-24 DIAGNOSIS — R9431 Abnormal electrocardiogram [ECG] [EKG]: Secondary | ICD-10-CM | POA: Diagnosis not present

## 2021-05-24 DIAGNOSIS — O368130 Decreased fetal movements, third trimester, not applicable or unspecified: Secondary | ICD-10-CM | POA: Insufficient documentation

## 2021-05-24 DIAGNOSIS — J4531 Mild persistent asthma with (acute) exacerbation: Secondary | ICD-10-CM | POA: Insufficient documentation

## 2021-05-24 DIAGNOSIS — Z3689 Encounter for other specified antenatal screening: Secondary | ICD-10-CM

## 2021-05-24 DIAGNOSIS — Z20822 Contact with and (suspected) exposure to covid-19: Secondary | ICD-10-CM | POA: Insufficient documentation

## 2021-05-24 DIAGNOSIS — R0981 Nasal congestion: Secondary | ICD-10-CM | POA: Diagnosis not present

## 2021-05-24 DIAGNOSIS — O210 Mild hyperemesis gravidarum: Secondary | ICD-10-CM | POA: Insufficient documentation

## 2021-05-24 DIAGNOSIS — R0602 Shortness of breath: Secondary | ICD-10-CM

## 2021-05-24 DIAGNOSIS — O99513 Diseases of the respiratory system complicating pregnancy, third trimester: Secondary | ICD-10-CM | POA: Diagnosis present

## 2021-05-24 DIAGNOSIS — Z3A35 35 weeks gestation of pregnancy: Secondary | ICD-10-CM

## 2021-05-24 DIAGNOSIS — J45909 Unspecified asthma, uncomplicated: Secondary | ICD-10-CM

## 2021-05-24 DIAGNOSIS — O368131 Decreased fetal movements, third trimester, fetus 1: Secondary | ICD-10-CM | POA: Diagnosis not present

## 2021-05-24 LAB — URINALYSIS, ROUTINE W REFLEX MICROSCOPIC
Bilirubin Urine: NEGATIVE
Glucose, UA: NEGATIVE mg/dL
Hgb urine dipstick: NEGATIVE
Ketones, ur: NEGATIVE mg/dL
Leukocytes,Ua: NEGATIVE
Nitrite: NEGATIVE
Protein, ur: NEGATIVE mg/dL
Specific Gravity, Urine: 1.014 (ref 1.005–1.030)
pH: 6 (ref 5.0–8.0)

## 2021-05-24 LAB — CBC WITH DIFFERENTIAL/PLATELET
Abs Immature Granulocytes: 0.11 10*3/uL — ABNORMAL HIGH (ref 0.00–0.07)
Basophils Absolute: 0.1 10*3/uL (ref 0.0–0.1)
Basophils Relative: 1 %
Eosinophils Absolute: 0.2 10*3/uL (ref 0.0–0.5)
Eosinophils Relative: 2 %
HCT: 32.7 % — ABNORMAL LOW (ref 36.0–46.0)
Hemoglobin: 11 g/dL — ABNORMAL LOW (ref 12.0–15.0)
Immature Granulocytes: 1 %
Lymphocytes Relative: 18 %
Lymphs Abs: 1.5 10*3/uL (ref 0.7–4.0)
MCH: 27.2 pg (ref 26.0–34.0)
MCHC: 33.6 g/dL (ref 30.0–36.0)
MCV: 80.9 fL (ref 80.0–100.0)
Monocytes Absolute: 1 10*3/uL (ref 0.1–1.0)
Monocytes Relative: 11 %
Neutro Abs: 5.6 10*3/uL (ref 1.7–7.7)
Neutrophils Relative %: 67 %
Platelets: 257 10*3/uL (ref 150–400)
RBC: 4.04 MIL/uL (ref 3.87–5.11)
RDW: 13.4 % (ref 11.5–15.5)
WBC: 8.4 10*3/uL (ref 4.0–10.5)
nRBC: 0 % (ref 0.0–0.2)

## 2021-05-24 LAB — COMPREHENSIVE METABOLIC PANEL
ALT: 15 U/L (ref 0–44)
AST: 19 U/L (ref 15–41)
Albumin: 2.7 g/dL — ABNORMAL LOW (ref 3.5–5.0)
Alkaline Phosphatase: 140 U/L — ABNORMAL HIGH (ref 38–126)
Anion gap: 9 (ref 5–15)
BUN: <5 mg/dL — ABNORMAL LOW (ref 6–20)
CO2: 18 mmol/L — ABNORMAL LOW (ref 22–32)
Calcium: 8.3 mg/dL — ABNORMAL LOW (ref 8.9–10.3)
Chloride: 105 mmol/L (ref 98–111)
Creatinine, Ser: 0.38 mg/dL — ABNORMAL LOW (ref 0.44–1.00)
GFR, Estimated: >60 mL/min (ref >60)
Glucose, Bld: 88 mg/dL (ref 70–99)
Potassium: 3.7 mmol/L (ref 3.5–5.1)
Sodium: 132 mmol/L — ABNORMAL LOW (ref 135–145)
Total Bilirubin: 0.3 mg/dL (ref 0.3–1.2)
Total Protein: 6 g/dL — ABNORMAL LOW (ref 6.5–8.1)

## 2021-05-24 LAB — RESP PANEL BY RT-PCR (FLU A&B, COVID) ARPGX2
Influenza A by PCR: NEGATIVE
Influenza B by PCR: NEGATIVE
SARS Coronavirus 2 by RT PCR: NEGATIVE

## 2021-05-24 MED ORDER — IPRATROPIUM-ALBUTEROL 0.5-2.5 (3) MG/3ML IN SOLN
3.0000 mL | Freq: Once | RESPIRATORY_TRACT | Status: AC
  Administered 2021-05-24: 3 mL via RESPIRATORY_TRACT
  Filled 2021-05-24: qty 3

## 2021-05-24 MED ORDER — ACETAMINOPHEN 500 MG PO TABS
1000.0000 mg | ORAL_TABLET | Freq: Once | ORAL | Status: AC
  Administered 2021-05-24: 1000 mg via ORAL
  Filled 2021-05-24: qty 2

## 2021-05-24 MED ORDER — ALBUTEROL SULFATE (2.5 MG/3ML) 0.083% IN NEBU: 2.5000 mg | INHALATION_SOLUTION | Freq: Once | RESPIRATORY_TRACT | Status: DC

## 2021-05-24 MED ORDER — IPRATROPIUM-ALBUTEROL 20-100 MCG/ACT IN AERS: 1.0000 | INHALATION_SPRAY | Freq: Four times a day (QID) | RESPIRATORY_TRACT | 1 refills | Status: DC

## 2021-05-24 NOTE — ED Provider Notes (Signed)
Emergency Medicine Provider Triage Evaluation Note  Gina Holmes , a 20 y.o. female  was evaluated in triage.  Pt complains of cold-like symptoms for 2 weeks.  Patient is [redacted] weeks pregnant.  She also states that she has a history of asthma, and for the past 3 days she has had more wheezing and is needed her inhaler more.  She is also been disinterested in eating and drinking, thinks that she is dehydrated.  With all of this she has noted decreased fetal movement.  No abdominal pain or vaginal bleeding.  Review of Systems  Positive: Cough shortness of breath, wheezing Negative: Chest pain, abdominal pain, pelvic pain, vaginal bleeding  Physical Exam  BP 127/69   Pulse 80   Temp 99 F (37.2 C) (Oral)   Resp 16   LMP 09/15/2020   SpO2 100%  Gen:   Awake, no distress   Resp:  Normal effort  MSK:   Moves extremities without difficulty  Other:    Medical Decision Making  Medically screening exam initiated at 11:20 AM.  Appropriate orders placed.  Gina Holmes was informed that the remainder of the evaluation will be completed by another provider, this initial triage assessment does not replace that evaluation, and the importance of remaining in the ED until their evaluation is complete.  Discussed with MAU APP who will accept patient   Estill Cotta 05/24/21 1123    Valarie Merino, MD 05/25/21 1624

## 2021-05-24 NOTE — Discharge Instructions (Signed)

## 2021-05-24 NOTE — MAU Provider Note (Signed)
History     CSN: [id number]  Arrival date and time: 05/24/21 7408   Event Date/Time   First Provider Initiated Contact with Patient 05/24/21 1246      Chief Complaint  Patient presents with   Nasal Congestion   Wheezing   Asthma   Decreased Fetal Movement   Ms. Gina Holmes is a 20 y.o. G2P0010 at 45w3dwho presents to MAU for shortness of breath. Patient reports for the past two weeks she has had symptoms of a cold that include: runny nose, stuffy nose, cough, congestion. Patient states in the past 3 days it has caused her to have her asthma symptoms flair with wheezing and SOB. Patient states she also feels dehydrated. Patient states she does have an albuterol inhaler at home that she has been using and reports that her symptoms of SOB and wheezing improve for a few hours and then return. Patient states today that she tried to use her inhaler, but states that it was not relieving her symptoms has it had been. Patient reports she was taking sudafed and DayQuil for her symptoms. Sudafed was working and then stopped working and DayQuil did not work for her.  Patient reports since she has been having SOB, patient also reports decreased fetal movement.  Pt denies VB, LOF, ctx, vaginal discharge/odor/itching. Pt denies N/V, abdominal pain, constipation, diarrhea, or urinary problems. Pt denies fever, chills, fatigue, sweating or changes in appetite. Pt denies chest pain. Pt denies dizziness, HA, light-headedness, weakness.  Problems this pregnancy include: hyperemesis. Allergies? Fruits, no medications Current medications/supplements? Albuterol, PNV Prenatal care provider? [facility], next appt 05/25/2021   OB History     Gravida  2   Para      Term      Preterm      AB  1   Living         SAB  1   IAB      Ectopic      Multiple      Live Births              Past Medical History:  Diagnosis Date   Asthma     Past Surgical History:  Procedure Laterality  Date   NO PAST SURGERIES      Family History  Problem Relation Age of Onset   Obesity Mother    Obesity Father    Obesity Sister    Cancer Maternal Grandfather     Social History   Tobacco Use   Smoking status: Some Days    Packs/day: 0.25    Types: Cigarettes   Smokeless tobacco: Never   Tobacco comments:    1 cigarrette per day  Vaping Use   Vaping Use: Never used  Substance Use Topics   Alcohol use: Not Currently   Drug use: Yes    Types: Marijuana    Comment: November 2022    Allergies:  Allergies  Allergen Reactions   Black [employer] worker, communityand Shortness Of Breath   [employer]and Shortness Of Breath   Apple Hives    "RED APPLES" itchy throat also   Peach Flavor Hives   Pear Hives    Medications Prior to Admission  Medication Sig Dispense Refill Last Dose   Prenatal Vit-Fe Fumarate-FA (PRENATAL VITAMIN PLUS LOW IRON) 27-1 MG TABS Take 1 tablet by mouth daily.   05/24/2021 at 0730   doxylamine, Sleep, (UNISOM) 25 MG tablet Take 1 tablet (25 mg total) by mouth 2 (two)  times daily. 60 tablet 0    Doxylamine-Pyridoxine (DICLEGIS) 10-10 MG TBEC Take 1 tablet by mouth at bedtime. 60 tablet 2    famotidine (PEPCID) 20 MG tablet Take 1 tablet (20 mg total) by mouth 2 (two) times daily. 60 tablet 1    ondansetron (ZOFRAN ODT) 8 MG disintegrating tablet Take 1 tablet (8 mg total) by mouth every 8 (eight) hours as needed for nausea or vomiting. 20 tablet 2    polyethylene glycol (MIRALAX) 17 g packet Take 17 g by mouth daily. 14 each 0    Prenatal MV & Min w/FA-DHA (PRENATAL GUMMIES PO) Take by mouth.      promethazine (PHENERGAN) 25 MG tablet Take 1 tablet (25 mg total) by mouth every 6 (six) hours as needed for nausea or vomiting. 30 tablet 0    valACYclovir (VALTREX) 500 MG tablet Take 500 mg by mouth 2 (two) times daily.   More than a month    Review of Systems  Constitutional:  Negative for chills, diaphoresis, fatigue and fever.  HENT:  Positive for  congestion and rhinorrhea.   Eyes:  Negative for visual disturbance.  Respiratory:  Positive for cough, shortness of breath and wheezing.   Cardiovascular:  Negative for chest pain.  Gastrointestinal:  Negative for abdominal pain, constipation, diarrhea, nausea and vomiting.  Genitourinary:  Negative for dysuria, flank pain, frequency, pelvic pain, urgency, vaginal bleeding and vaginal discharge.  Neurological:  Negative for dizziness, weakness, light-headedness and headaches.   Physical Exam   Blood pressure (!) 110/58, pulse 80, temperature 97.7 F (36.5 C), temperature source Oral, resp. rate 18, last menstrual period 09/15/2020, SpO2 100 %.  Patient Vitals for the past 24 hrs:  BP Temp Temp src Pulse Resp SpO2  05/24/21 1850 -- -- -- -- -- 100 %  05/24/21 1756 (!) 110/58 97.7 F (36.5 C) Oral 80 18 99 %  05/24/21 1230 -- -- -- -- -- 98 %  05/24/21 1213 124/63 97.7 F (36.5 C) Oral 90 18 97 %  05/24/21 0937 127/69 99 F (37.2 C) Oral 80 16 100 %   Physical Exam Vitals and nursing note reviewed.  Constitutional:      General: She is not in acute distress.    Appearance: Normal appearance. She is not ill-appearing, toxic-appearing or diaphoretic.  HENT:     Head: Normocephalic and atraumatic.  Cardiovascular:     Rate and Rhythm: Normal rate and regular rhythm.     Heart sounds: Normal heart sounds.  Pulmonary:     Effort: Pulmonary effort is normal.     Breath sounds: No decreased air movement. Examination of the right-middle field reveals wheezing. Examination of the right-lower field reveals wheezing. Examination of the left-lower field reveals wheezing. Wheezing present.  Skin:    General: Skin is warm and dry.  Neurological:     Mental Status: She is alert and oriented to person, place, and time.  Psychiatric:        Mood and Affect: Mood normal.        Behavior: Behavior normal.        Thought Content: Thought content normal.        Judgment: Judgment normal.    Results for orders placed or performed during the hospital encounter of 05/24/21 (from the past 24 hour(s))  Resp Panel by RT-PCR (Flu A&B, Covid) Nasopharyngeal Swab     Status: None   Collection Time: 05/24/21  1:04 PM   Specimen: Nasopharyngeal Swab; Nasopharyngeal(NP) swabs in  vial transport medium  Result Value Ref Range   SARS Coronavirus 2 by RT PCR NEGATIVE NEGATIVE   Influenza A by PCR NEGATIVE NEGATIVE   Influenza B by PCR NEGATIVE NEGATIVE  CBC with Differential/Platelet     Status: Abnormal   Collection Time: 05/24/21  1:07 PM  Result Value Ref Range   WBC 8.4 4.0 - 10.5 K/uL   RBC 4.04 3.87 - 5.11 MIL/uL   Hemoglobin 11.0 (L) 12.0 - 15.0 g/dL   HCT 32.7 (L) 36.0 - 46.0 %   MCV 80.9 80.0 - 100.0 fL   MCH 27.2 26.0 - 34.0 pg   MCHC 33.6 30.0 - 36.0 g/dL   RDW 13.4 11.5 - 15.5 %   Platelets 257 150 - 400 K/uL   nRBC 0.0 0.0 - 0.2 %   Neutrophils Relative % 67 %   Neutro Abs 5.6 1.7 - 7.7 K/uL   Lymphocytes Relative 18 %   Lymphs Abs 1.5 0.7 - 4.0 K/uL   Monocytes Relative 11 %   Monocytes Absolute 1.0 0.1 - 1.0 K/uL   Eosinophils Relative 2 %   Eosinophils Absolute 0.2 0.0 - 0.5 K/uL   Basophils Relative 1 %   Basophils Absolute 0.1 0.0 - 0.1 K/uL   Immature Granulocytes 1 %   Abs Immature Granulocytes 0.11 (H) 0.00 - 0.07 K/uL  Comprehensive metabolic panel     Status: Abnormal   Collection Time: 05/24/21  1:07 PM  Result Value Ref Range   Sodium 132 (L) 135 - 145 mmol/L   Potassium 3.7 3.5 - 5.1 mmol/L   Chloride 105 98 - 111 mmol/L   CO2 18 (L) 22 - 32 mmol/L   Glucose, Bld 88 70 - 99 mg/dL   BUN <5 (L) 6 - 20 mg/dL   Creatinine, Ser 0.38 (L) 0.44 - 1.00 mg/dL   Calcium 8.3 (L) 8.9 - 10.3 mg/dL   Total Protein 6.0 (L) 6.5 - 8.1 g/dL   Albumin 2.7 (L) 3.5 - 5.0 g/dL   AST 19 15 - 41 U/L   ALT 15 0 - 44 U/L   Alkaline Phosphatase 140 (H) 38 - 126 U/L   Total Bilirubin 0.3 0.3 - 1.2 mg/dL   GFR, Estimated >60 >60 mL/min   Anion gap 9 5 - 15   Urinalysis, Routine w reflex microscopic Urine, Clean Catch     Status: None   Collection Time: 05/24/21  2:20 PM  Result Value Ref Range   Color, Urine YELLOW YELLOW   APPearance CLEAR CLEAR   Specific Gravity, Urine 1.014 1.005 - 1.030   pH 6.0 5.0 - 8.0   Glucose, UA NEGATIVE NEGATIVE mg/dL   Hgb urine dipstick NEGATIVE NEGATIVE   Bilirubin Urine NEGATIVE NEGATIVE   Ketones, ur NEGATIVE NEGATIVE mg/dL   Protein, ur NEGATIVE NEGATIVE mg/dL   Nitrite NEGATIVE NEGATIVE   Leukocytes,Ua NEGATIVE NEGATIVE   DG Chest Port 1 View  Result Date: 05/24/2021 CLINICAL DATA:  Short of breath. EXAM: PORTABLE CHEST 1 VIEW COMPARISON:  None. FINDINGS: The heart size and mediastinal contours are within normal limits. Both lungs are clear. The visualized skeletal structures are unremarkable. IMPRESSION: No active disease. Electronically Signed   By: Kerby Moors M.D.   On: 05/24/2021 13:36   [place] MFM OB FOLLOW UP  Result Date: 05/03/2021 ----------------------------------------------------------------------  OBSTETRICS REPORT                       (Signed Final 05/03/2021 03:34  pm) ---------------------------------------------------------------------- Patient Info  ID #:       [id number]                          D.O.B.:  07/11/2000 (20 yrs)  Name:       Gina Holmes                    Visit Date: 05/03/2021 02:44 pm ---------------------------------------------------------------------- Performed By  Attending:        Tama High MD        Referred By:      Bedelia Person  Performed By:     Germain Osgood            [facility]                                                             [place] [facility]                                                             [facility] ---------------------------------------------------------------------- Orders  #   Description                           Code        Ordered By  1  [place] MFM OB FOLLOW UP                   87681.15    Peterson Ao ----------------------------------------------------------------------  #  Order #                     Accession #                Episode #  1  [id number]                   [id number]                 [id number] ---------------------------------------------------------------------- Indications  Substance abuse affecting pregnancy,           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 Cord Insertion:      Visualized, central  Amniotic Fluid  AFI FV:      Within normal limits  AFI Sum(cm)     %Tile       Largest Pocket(cm)  19.39           73          9.28  RUQ(cm)       RLQ(cm)       LUQ(cm)        LLQ(cm)  9.28          4.14          0.99           4.98 ---------------------------------------------------------------------- Biometry  BPD:      86.3  mm     G. Age:  34w 6d         91  %    CI:         76.5   %    70 - 86                                                          FL/HC:      18.7   %    19.9 - 21.5  HC:      312.6  mm     G. Age:  35w 0d         70  %    HC/AC:      1.04        0.96 - 1.11  AC:      300.4  mm     G. Age:  34w 0d         82  %    FL/BPD:     67.6   %    71 - 87  FL:       58.3  mm     G. Age:  30w 3d        1.9  %    FL/AC:      19.4   %    20 - 24  HUM:      53.6  mm     G. Age:  31w 1d          25  %  LV:        1.8  mm  Est. FW:    2137  gm    4 lb 11 oz      51  % ---------------------------------------------------------------------- OB History  Gravidity:    2  TOP:  1        Living:  0 ---------------------------------------------------------------------- Gestational Age  LMP:           32w 6d        Date:  09/15/20                 EDD:   06/22/21  U/S Today:     33w 4d                                        EDD:   06/17/21  Best:          32w 6d     Det. By:  LMP  (09/15/20)          EDD:   06/22/21 ---------------------------------------------------------------------- Anatomy  Cranium:               Appears normal         LVOT:                   Previously seen  Cavum:                 Previously seen        Aortic Arch:            Previously seen  Ventricles:            Appears normal         Ductal Arch:            Previously seen  Choroid Plexus:        Previously seen        Diaphragm:              Appears normal  Cerebellum:            Previously seen        Stomach:                Appears normal, left                                                                        sided  Posterior Fossa:       Previously seen        Abdomen:                Appears normal  Nuchal Fold:           Previously seen        Abdominal Wall:         Previously seen  Face:                  Profile nl; orbits     Cord Vessels:           Previously seen                         prev seen  Lips:                  Previously seen        Kidneys:  Appear normal  Palate:                Previously seen        Bladder:                Appears normal  Thoracic:              Previously seen        Spine:                  Previously seen  Heart:                 Previously seen        Upper Extremities:      Previously seen  RVOT:                  Previously seen        Lower Extremities:      Previously seen  Other:  Fetus appears to be a female. Lenses previously visualized. Heels/feet          and open  hands/5th digits previously visualized. ---------------------------------------------------------------------- Cervix Uterus Adnexa  Cervix  Not visualized (advanced GA >24wks)  Uterus  No abnormality visualized.  Right Ovary  Dermoid cyst 2.11 x 1.61 x 1.75 cm  Left Ovary  Not visualized.  Cul De Sac  No free fluid seen.  Adnexa  No adnexal mass visualized. ---------------------------------------------------------------------- Impression  Fetal growth is appropriate for gestational age .Amniotic fluid  is normal and good fetal activity is seen .  Patient reports she does not have gestational diabetes.  Blood pressure today at her office is 119/53 mmHg.  She reports she smokes cigarettes and marijuana.  I briefly  discussed the effects of cigarette smoking including placental  abruption. ---------------------------------------------------------------------- Recommendations  -An appointment was made for her to return in 4 weeks for  fetal growth assessment. ----------------------------------------------------------------------                  Tama High, MD Electronically Signed Final Report   05/03/2021 03:34 pm ----------------------------------------------------------------------   MAU Course  Procedures  MDM -URI symptoms x2 weeks with exacerbation of asthma x3 days, albuterol inhaler not working since this morning -pt coughing in room with provider -VSS, O2 97-100% on RA -minor inspiratory wheezing on exam -pt also reports DFM, but after being in MAU states that the movement has returned to normal. Patient was pushing clicker, but then fell asleep, which she reports as the reason for not pushing the clicker more (pt pressed button 7 times in 1 hour) -UA: WNL, no evidence of dehydration -CBC w/ Diff: WNL -CMP: no abnormalities requiring treatment -COVID/Flu: negative -Chest X-ray: negative -EKG: consulted with cardiology, per Dr. Sallyanne Kuster, may be a juvenille repolarization pattern with inverted  t-waves and high voltage findings, or possible LVH and recommends outpatient echocardiogram as this is an incidental finding -albuterol nebulizer treatment ordered, after treatment patient reports SOB resolved and lungs clear to auscultation on repeat examination -pt reports HA while in MAU, Tylenol 1075m given, HA resolved EFM: reactive       -baseline: 130/120       -variability: moderate       -accels: present, 15x15       -decels: absent       -TOCO: irritability, few ctx, pt not feeling -pt discharged to home in stable condition  Orders Placed This Encounter  Procedures   Resp Panel by RT-PCR (Flu A&B, Covid) Nasopharyngeal Swab  Standing Status:   Standing    Number of Occurrences:   1   DG Chest Port 1 View    Standing Status:   Standing    Number of Occurrences:   1    Order Specific Question:   Symptom/Reason for Exam    Answer:   Shortness of breath [786.05.ICD-9-CM]   CBC with Differential/Platelet    Standing Status:   Standing    Number of Occurrences:   1   Comprehensive metabolic panel    Standing Status:   Standing    Number of Occurrences:   1   Urinalysis, Routine w reflex microscopic    Standing Status:   Standing    Number of Occurrences:   1   EKG 12-Lead    Standing Status:   Standing    Number of Occurrences:   1   ECHOCARDIOGRAM COMPLETE    Standing Status:   Future    Standing Expiration Date:   05/24/2022    Order Specific Question:   Where should this test be performed    Answer:   [place]    Order Specific Question:   Perflutren DEFINITY (image enhancing agent) should be administered unless hypersensitivity or allergy exist    Answer:   Do NOT administer Perflutren    Order Specific Question:   Reason for no Perflutren    Answer:   Other    Order Specific Question:   Specify other    Answer:   pregnancy    Order Specific Question:   Is a special reader required? (athlete or structural heart)    Answer:   No    Order Specific Question:    Reason for exam-Echo    Answer:   Abnormal ECG  R94.31   Discharge patient    Order Specific Question:   Discharge disposition    Answer:   01-Home or Self Care [1]    Order Specific Question:   Discharge patient date    Answer:   05/24/2021   Meds ordered this encounter  Medications   DISCONTD: albuterol (PROVENTIL) (2.5 MG/3ML) 0.083% nebulizer solution 2.5 mg   acetaminophen (TYLENOL) tablet 1,000 mg   ipratropium-albuterol (DUONEB) 0.5-2.5 (3) MG/3ML nebulizer solution 3 mL   Ipratropium-Albuterol (COMBIVENT) 20-100 MCG/ACT AERS respimat    Sig: Inhale 1 puff into the lungs every 6 (six) hours.    Dispense:  1 each    Refill:  1    Order Specific Question:   Supervising Provider    Answer:   Wood[place] Mode [[id number]]   Assessment and Plan   1. Mild persistent asthma with exacerbation   2. Shortness of breath   3. [redacted] weeks gestation of pregnancy   4. NST (non-stress test) reactive   5. Abnormal EKG    Allergies as of 05/24/2021       Reactions   [employer], Shortness Of Breath   [employer], Shortness Of Breath   Apple Hives   "RED APPLES" itchy throat also   Peach Flavor Hives   Pear Hives        Medication List     TAKE these medications    Doxylamine-Pyridoxine 10-10 MG Tbec Commonly known as: Diclegis Take 1 tablet by mouth at bedtime.   famotidine 20 MG tablet Commonly known as: PEPCID Take 1 tablet (20 mg total) by mouth 2 (two) times daily.   Ipratropium-Albuterol 20-100 MCG/ACT Aers respimat Commonly known as: COMBIVENT Inhale 1 puff into  the lungs every 6 (six) hours.   ondansetron 8 MG disintegrating tablet Commonly known as: Zofran ODT Take 1 tablet (8 mg total) by mouth every 8 (eight) hours as needed for nausea or vomiting.   polyethylene glycol 17 g packet Commonly known as: MiraLax Take 17 g by mouth daily.   PRENATAL GUMMIES PO Take by mouth.   Prenatal Vitamin Plus Low Iron 27-1 MG Tabs Take 1 tablet by mouth  daily.   promethazine 25 MG tablet Commonly known as: PHENERGAN Take 1 tablet (25 mg total) by mouth every 6 (six) hours as needed for nausea or vomiting.   SM Sleep Aid 25 MG tablet Generic drug: doxylamine (Sleep) Take 1 tablet (25 mg total) by mouth 2 (two) times daily.   valACYclovir 500 MG tablet Commonly known as: VALTREX Take 500 mg by mouth 2 (two) times daily.       -RX Combivent -order for outpatient echocardiogram -pt to keep appointment with [facility] tomorrow -return MAU precautions given -pt discharged to home in stable condition  Elmyra Ricks E  05/24/2021, 9:01 PM

## 2021-05-24 NOTE — MAU Note (Signed)
Presents stating she has had a cold for 3 weeks, but reports wheezing and SOB the past 3 days.  States has a hx of asthma.  Also states has notices decrease FM for 3 days.   Denies VB or LOF.

## 2021-05-24 NOTE — ED Triage Notes (Signed)
Pt. Stated, I [redacted] weeks pregnant and Ive had a cold for 2 weeks. For 2 weeks Ive had a cold and now its effecting me, Im having wheezing, and I have asthma. I also think Im dehydrated.

## 2021-05-24 NOTE — ED Notes (Signed)
Report to MAU, Transport called

## 2021-05-24 NOTE — Progress Notes (Signed)
Asked to review ECG. Changes are nonspecific, but could be related to left ventricular hypertrophy (anterior changes could be persistent juvenile repolarization). No chest pain. Dyspnea better with bronchodilators. No old ECG. No cardiomegaly or CHF on CXR. Recommend non-urgent echo to look for possible hypertrophy/cardiomyopathy.

## 2021-05-31 LAB — OB RESULTS CONSOLE GBS: GBS: POSITIVE

## 2021-06-01 ENCOUNTER — Ambulatory Visit: Payer: Medicaid Other | Admitting: *Deleted

## 2021-06-01 ENCOUNTER — Other Ambulatory Visit: Payer: Self-pay | Admitting: Obstetrics and Gynecology

## 2021-06-01 ENCOUNTER — Ambulatory Visit: Payer: Medicaid Other | Attending: Obstetrics and Gynecology

## 2021-06-01 ENCOUNTER — Other Ambulatory Visit: Payer: Self-pay

## 2021-06-01 ENCOUNTER — Encounter: Payer: Self-pay | Admitting: *Deleted

## 2021-06-01 VITALS — BP 111/56 | HR 109

## 2021-06-01 DIAGNOSIS — E669 Obesity, unspecified: Secondary | ICD-10-CM | POA: Diagnosis not present

## 2021-06-01 DIAGNOSIS — O3483 Maternal care for other abnormalities of pelvic organs, third trimester: Secondary | ICD-10-CM | POA: Diagnosis not present

## 2021-06-01 DIAGNOSIS — Z3A37 37 weeks gestation of pregnancy: Secondary | ICD-10-CM

## 2021-06-01 DIAGNOSIS — F1210 Cannabis abuse, uncomplicated: Secondary | ICD-10-CM | POA: Insufficient documentation

## 2021-06-01 DIAGNOSIS — O99213 Obesity complicating pregnancy, third trimester: Secondary | ICD-10-CM

## 2021-06-01 DIAGNOSIS — D279 Benign neoplasm of unspecified ovary: Secondary | ICD-10-CM | POA: Insufficient documentation

## 2021-06-01 DIAGNOSIS — O409XX0 Polyhydramnios, unspecified trimester, not applicable or unspecified: Secondary | ICD-10-CM

## 2021-06-01 DIAGNOSIS — O99333 Smoking (tobacco) complicating pregnancy, third trimester: Secondary | ICD-10-CM | POA: Diagnosis not present

## 2021-06-01 NOTE — Procedures (Signed)
Gina Holmes [birth year] [redacted]w[redacted]d  Fetus A Non-Stress Test Interpretation for 06/01/21  Indication: Unsatisfactory BPP and polyhydramnios  Fetal Heart Rate A Mode: External Baseline Rate (A): 150 bpm Variability: Moderate Accelerations: 15 x 15 Decelerations: Variable  Uterine Activity Mode: Toco Contraction Frequency (min): irreg Contraction Duration (sec): 40-70 Contraction Quality: Mild Resting Tone Palpated: Relaxed Resting Time: Adequate  Interpretation (Fetal Testing) Nonstress Test Interpretation: Reactive Overall Impression: Reassuring for gestational age Comments: tracing reviewed by Dr. Donalee Citrin

## 2021-06-01 NOTE — Procedures (Signed)
Gina Holmes 2000-07-13 [redacted]w[redacted]d  Fetus A Non-Stress Test Interpretation for 06/01/21  Indication: Unsatisfactory BPP and polyhydramnios  Fetal Heart Rate A Mode: External Baseline Rate (A): 150 bpm Variability: Moderate Accelerations: 15 x 15 Decelerations: Variable  Uterine Activity Mode: Toco Contraction Frequency (min): irregular Contraction Duration (sec): 40-70 Contraction Quality: Mild Resting Tone Palpated: Relaxed Resting Time: Adequate  Interpretation (Fetal Testing) Nonstress Test Interpretation: Reactive Overall Impression: Reassuring for gestational age Comments: tracing reviewed by Dr. Donalee Citrin

## 2021-06-06 ENCOUNTER — Other Ambulatory Visit: Payer: Self-pay | Admitting: *Deleted

## 2021-06-06 DIAGNOSIS — O99320 Drug use complicating pregnancy, unspecified trimester: Secondary | ICD-10-CM

## 2021-06-06 DIAGNOSIS — Z6835 Body mass index (BMI) 35.0-35.9, adult: Secondary | ICD-10-CM

## 2021-06-07 ENCOUNTER — Ambulatory Visit (HOSPITAL_COMMUNITY): Payer: Medicaid Other | Attending: Cardiology

## 2021-06-07 ENCOUNTER — Other Ambulatory Visit: Payer: Self-pay

## 2021-06-07 ENCOUNTER — Encounter (HOSPITAL_COMMUNITY): Payer: Self-pay | Admitting: Obstetrics and Gynecology

## 2021-06-07 ENCOUNTER — Inpatient Hospital Stay (HOSPITAL_COMMUNITY)
Admission: AD | Admit: 2021-06-07 | Discharge: 2021-06-07 | Disposition: A | Discharge status: Home / Self Care | Payer: Medicaid Other | Attending: Obstetrics and Gynecology | Admitting: Obstetrics and Gynecology

## 2021-06-07 DIAGNOSIS — O479 False labor, unspecified: Secondary | ICD-10-CM

## 2021-06-07 DIAGNOSIS — Z3A37 37 weeks gestation of pregnancy: Secondary | ICD-10-CM | POA: Diagnosis not present

## 2021-06-07 DIAGNOSIS — R9431 Abnormal electrocardiogram [ECG] [EKG]: Secondary | ICD-10-CM | POA: Diagnosis present

## 2021-06-07 LAB — ECHOCARDIOGRAM COMPLETE
Area-P 1/2: 3.85 cm2
S' Lateral: 3.4 cm

## 2021-06-07 NOTE — MAU Note (Signed)
Pt reports she has had a mucous discharge and today she has had a lot of pressure in her vagina and rectum. Denies bleeding. Reports positive fetal movement. Denies contractions.

## 2021-06-07 NOTE — MAU Provider Note (Signed)
Gina Holmes is a 20 y.o. G35P0010 female at [redacted]w[redacted]d.  RN labor check, not seen by provider.  SVE by RN: Dilation: Fingertip Effacement (%): 50 Station: -3 Exam by:: weston,rn  NST: FHR baseline 135 bpm, Variability: moderate, Accelerations:present, Decelerations:  Absent= Cat 1/Reactive Toco: Occasional contractions   Assessment/Plan: - SVE as above per RN - Few contractions on tocometer - Not in active labor - Offered recheck in 1 hour in MAU or discharge. Patient would prefer discharge given that she is not in labor.  - Cat 1/reactive NST - Stable for discharge home with return precautions   Genia Del, MD 06/07/2021 5:58 PM

## 2021-06-09 ENCOUNTER — Ambulatory Visit: Payer: Medicaid Other | Admitting: *Deleted

## 2021-06-09 ENCOUNTER — Encounter: Payer: Self-pay | Admitting: *Deleted

## 2021-06-09 ENCOUNTER — Other Ambulatory Visit: Payer: Self-pay

## 2021-06-09 ENCOUNTER — Ambulatory Visit: Payer: Medicaid Other | Attending: Obstetrics and Gynecology

## 2021-06-09 VITALS — BP 111/54 | HR 99

## 2021-06-09 DIAGNOSIS — Z3A38 38 weeks gestation of pregnancy: Secondary | ICD-10-CM

## 2021-06-09 DIAGNOSIS — O99320 Drug use complicating pregnancy, unspecified trimester: Secondary | ICD-10-CM | POA: Diagnosis not present

## 2021-06-09 DIAGNOSIS — O99213 Obesity complicating pregnancy, third trimester: Secondary | ICD-10-CM | POA: Diagnosis not present

## 2021-06-09 DIAGNOSIS — E669 Obesity, unspecified: Secondary | ICD-10-CM

## 2021-06-09 DIAGNOSIS — O99323 Drug use complicating pregnancy, third trimester: Secondary | ICD-10-CM | POA: Diagnosis not present

## 2021-06-09 DIAGNOSIS — Z6835 Body mass index (BMI) 35.0-35.9, adult: Secondary | ICD-10-CM

## 2021-06-10 ENCOUNTER — Other Ambulatory Visit: Payer: Self-pay | Admitting: Advanced Practice Midwife

## 2021-06-14 ENCOUNTER — Other Ambulatory Visit: Payer: Self-pay | Admitting: Advanced Practice Midwife

## 2021-06-15 ENCOUNTER — Encounter (HOSPITAL_COMMUNITY): Payer: Self-pay | Admitting: Family Medicine

## 2021-06-15 ENCOUNTER — Inpatient Hospital Stay (HOSPITAL_COMMUNITY): Payer: Medicaid Other

## 2021-06-15 ENCOUNTER — Inpatient Hospital Stay (HOSPITAL_COMMUNITY)
Admission: AD | Admit: 2021-06-15 | Discharge: 2021-06-19 | DRG: 787 | Disposition: A | Discharge status: Home / Self Care | Payer: Medicaid Other | Attending: Family Medicine | Admitting: Family Medicine

## 2021-06-15 ENCOUNTER — Other Ambulatory Visit: Payer: Self-pay

## 2021-06-15 DIAGNOSIS — O403XX0 Polyhydramnios, third trimester, not applicable or unspecified: Secondary | ICD-10-CM | POA: Diagnosis present

## 2021-06-15 DIAGNOSIS — O99324 Drug use complicating childbirth: Secondary | ICD-10-CM | POA: Diagnosis present

## 2021-06-15 DIAGNOSIS — Z20822 Contact with and (suspected) exposure to covid-19: Secondary | ICD-10-CM | POA: Diagnosis present

## 2021-06-15 DIAGNOSIS — O9081 Anemia of the puerperium: Secondary | ICD-10-CM | POA: Diagnosis not present

## 2021-06-15 DIAGNOSIS — J45909 Unspecified asthma, uncomplicated: Secondary | ICD-10-CM | POA: Diagnosis present

## 2021-06-15 DIAGNOSIS — O99214 Obesity complicating childbirth: Secondary | ICD-10-CM | POA: Diagnosis present

## 2021-06-15 DIAGNOSIS — Z87891 Personal history of nicotine dependence: Secondary | ICD-10-CM | POA: Diagnosis not present

## 2021-06-15 DIAGNOSIS — Z30017 Encounter for initial prescription of implantable subdermal contraceptive: Secondary | ICD-10-CM

## 2021-06-15 DIAGNOSIS — O9952 Diseases of the respiratory system complicating childbirth: Secondary | ICD-10-CM | POA: Diagnosis present

## 2021-06-15 DIAGNOSIS — O99824 Streptococcus B carrier state complicating childbirth: Secondary | ICD-10-CM | POA: Diagnosis present

## 2021-06-15 DIAGNOSIS — Z3A39 39 weeks gestation of pregnancy: Secondary | ICD-10-CM | POA: Diagnosis not present

## 2021-06-15 DIAGNOSIS — F1290 Cannabis use, unspecified, uncomplicated: Secondary | ICD-10-CM | POA: Diagnosis present

## 2021-06-15 DIAGNOSIS — O409XX0 Polyhydramnios, unspecified trimester, not applicable or unspecified: Secondary | ICD-10-CM | POA: Diagnosis present

## 2021-06-15 DIAGNOSIS — D62 Acute posthemorrhagic anemia: Secondary | ICD-10-CM | POA: Diagnosis not present

## 2021-06-15 LAB — TYPE AND SCREEN
ABO/RH(D): A POS
Antibody Screen: NEGATIVE

## 2021-06-15 LAB — CBC
HCT: 34 % — ABNORMAL LOW (ref 36.0–46.0)
Hemoglobin: 11.2 g/dL — ABNORMAL LOW (ref 12.0–15.0)
MCH: 26.5 pg (ref 26.0–34.0)
MCHC: 32.9 g/dL (ref 30.0–36.0)
MCV: 80.6 fL (ref 80.0–100.0)
Platelets: 271 10*3/uL (ref 150–400)
RBC: 4.22 MIL/uL (ref 3.87–5.11)
RDW: 13.4 % (ref 11.5–15.5)
WBC: 8.1 10*3/uL (ref 4.0–10.5)
nRBC: 0 % (ref 0.0–0.2)

## 2021-06-15 LAB — RESP PANEL BY RT-PCR (FLU A&B, COVID) ARPGX2
Influenza A by PCR: NEGATIVE
Influenza B by PCR: NEGATIVE
SARS Coronavirus 2 by RT PCR: NEGATIVE

## 2021-06-15 MED ORDER — SOD CITRATE-CITRIC ACID 500-334 MG/5ML PO SOLN
30.0000 mL | ORAL | Status: DC | PRN
  Administered 2021-06-17: 30 mL via ORAL
  Filled 2021-06-15: qty 30

## 2021-06-15 MED ORDER — PENICILLIN G POT IN DEXTROSE 60000 UNIT/ML IV SOLN
3.0000 10*6.[IU] | INTRAVENOUS | Status: DC
  Administered 2021-06-16 – 2021-06-17 (×9): 3 10*6.[IU] via INTRAVENOUS
  Filled 2021-06-15 (×9): qty 50

## 2021-06-15 MED ORDER — OXYTOCIN-SODIUM CHLORIDE 30-0.9 UT/500ML-% IV SOLN
2.5000 [IU]/h | INTRAVENOUS | Status: DC
  Filled 2021-06-15: qty 500

## 2021-06-15 MED ORDER — LACTATED RINGERS IV SOLN
INTRAVENOUS | Status: DC
  Administered 2021-06-17: 125 mL/h via INTRAVENOUS

## 2021-06-15 MED ORDER — LIDOCAINE HCL (PF) 1 % IJ SOLN: 30.0000 mL | INTRAMUSCULAR | Status: DC | PRN

## 2021-06-15 MED ORDER — OXYCODONE-ACETAMINOPHEN 5-325 MG PO TABS: 2.0000 | ORAL_TABLET | ORAL | Status: DC | PRN

## 2021-06-15 MED ORDER — OXYCODONE-ACETAMINOPHEN 5-325 MG PO TABS: 1.0000 | ORAL_TABLET | ORAL | Status: DC | PRN

## 2021-06-15 MED ORDER — ONDANSETRON HCL 4 MG/2ML IJ SOLN
4.0000 mg | Freq: Four times a day (QID) | INTRAMUSCULAR | Status: DC | PRN
  Administered 2021-06-16 – 2021-06-17 (×2): 4 mg via INTRAVENOUS
  Filled 2021-06-15 (×2): qty 2

## 2021-06-15 MED ORDER — SODIUM CHLORIDE 0.9 % IV SOLN
5.0000 10*6.[IU] | Freq: Once | INTRAVENOUS | Status: AC
  Administered 2021-06-16: 5 10*6.[IU] via INTRAVENOUS
  Filled 2021-06-15 (×2): qty 5

## 2021-06-15 MED ORDER — ACETAMINOPHEN 325 MG PO TABS: 650.0000 mg | ORAL_TABLET | ORAL | Status: DC | PRN

## 2021-06-15 MED ORDER — MISOPROSTOL 50MCG HALF TABLET
50.0000 ug | ORAL_TABLET | ORAL | Status: DC | PRN
  Administered 2021-06-15: 50 ug via BUCCAL

## 2021-06-15 MED ORDER — TERBUTALINE SULFATE 1 MG/ML IJ SOLN: 0.2500 mg | Freq: Once | INTRAMUSCULAR | Status: DC | PRN

## 2021-06-15 MED ORDER — LACTATED RINGERS IV SOLN
500.0000 mL | INTRAVENOUS | Status: DC | PRN
  Administered 2021-06-16 – 2021-06-17 (×2): 500 mL via INTRAVENOUS

## 2021-06-15 MED ORDER — FENTANYL CITRATE (PF) 100 MCG/2ML IJ SOLN
100.0000 ug | INTRAMUSCULAR | Status: DC | PRN
  Filled 2021-06-15: qty 2

## 2021-06-15 MED ORDER — OXYTOCIN BOLUS FROM INFUSION: 333.0000 mL | Freq: Once | INTRAVENOUS | Status: DC

## 2021-06-15 MED ORDER — MISOPROSTOL 50MCG HALF TABLET
ORAL_TABLET | ORAL | Status: AC
  Filled 2021-06-15: qty 1

## 2021-06-15 MED ORDER — MISOPROSTOL 25 MCG QUARTER TABLET
25.0000 ug | ORAL_TABLET | ORAL | Status: DC | PRN
  Administered 2021-06-15 (×2): 25 ug via VAGINAL
  Filled 2021-06-15 (×2): qty 1

## 2021-06-15 NOTE — H&P (Signed)
OBSTETRIC ADMISSION HISTORY AND PHYSICAL  Gina Holmes is a 20 y.o. female G2P0010 with IUP at [redacted]w[redacted]d by 6 wk [place] presenting for IOL due to mild polyhydramnios. She reports +FMs, No LOF, no VB, no blurry vision, headaches or peripheral edema, and RUQ pain.  She plans on formula feeding. She request inpatient nexplanon for birth control. She received her prenatal care at [facility]: By 6 wk [place] --->  Estimated Date of Delivery: 06/22/21  Sono:    @[redacted]w[redacted]d , CWD, normal anatomy, cephalic presentation,  [id number], 60% EFW   Prenatal History/Complications:  --Mild polyhydramnios (AFI 29) --Tobacco/THC use in pregnancy --Asthma    Past Medical History: Past Medical History:  Diagnosis Date   Asthma     Past Surgical History: Past Surgical History:  Procedure Laterality Date   NO PAST SURGERIES      Obstetrical History: OB History     Gravida  2   Para      Term      Preterm      AB  1   Living         SAB  1   IAB      Ectopic      Multiple      Live Births              Social History Social History   Socioeconomic History   Marital status: Single    Spouse name: Not on file   Number of children: Not on file   Years of education: Not on file   Highest education level: Not on file  Occupational History   Not on file  Tobacco Use   Smoking status: Former    Packs/day: 0.25    Types: Cigarettes   Smokeless tobacco: Never   Tobacco comments:    1 cigarrette per day  Vaping Use   Vaping Use: Never used  Substance and Sexual Activity   Alcohol use: Not Currently   Drug use: Not Currently    Types: Marijuana    Comment: last use Jun 03 2021   Sexual activity: Yes  Other Topics Concern   Not on file  Social History Narrative   Not on file   Social Determinants of Health   Financial Resource Strain: Not on file  Food Insecurity: Not on file  Transportation Needs: Not on file  Physical Activity: Not on file  Stress: Not on file  Social  Connections: Not on file    Family History: Family History  Problem Relation Age of Onset   Obesity Mother    Obesity Father    Obesity Sister    Cancer Maternal Grandfather     Allergies: Allergies  Allergen Reactions   Black Development worker, community and Shortness Of Breath   [employer] and Shortness Of Breath   Apple Hives    "RED APPLES" itchy throat also   Peach Flavor Hives   Pear Hives    Medications Prior to Admission  Medication Sig Dispense Refill Last Dose   Ipratropium-Albuterol (COMBIVENT) 20-100 MCG/ACT AERS respimat Inhale 1 puff into the lungs every 6 (six) hours. 1 each 1    Prenatal Vit-Fe Fumarate-FA (PRENATAL VITAMIN PLUS LOW IRON) 27-1 MG TABS Take 1 tablet by mouth daily.        Review of Systems   All systems reviewed and negative except as stated in HPI  Blood pressure 117/73, pulse 92, temperature 99.4 F (37.4 C), temperature source Oral, resp. rate 16,  height 5\' 1"  (1.549 m), weight 100.7 kg, last menstrual period 09/15/2020. General appearance: alert, cooperative, and no distress Lungs: Normal WOB Heart: regular rate and rhythm Abdomen: soft, non-tender Pelvic: NEFG Extremities: Homans sign is negative, no sign of DVT Presentation: cephalic  Fetal monitoringBaseline: 130 bpm, Variability: Good {> 6 bpm), Accelerations: Reactive, and Decelerations: Absent Uterine activity Occasional  Dilation: closed  Effacement (%): Thick Station: -3 Exam by:: Wilford Sports RN   Prenatal labs: ABO, Rh: --/--/A POS (12/07 1050) Antibody: NEG (12/07 1050) Rubella: Immune (06/09 0000) RPR: Nonreactive (06/09 0000)  HBsAg: Negative (06/09 0000)  HIV: Non-reactive (06/09 0000)  GBS: Positive/-- (11/22 0000)  1 hr Glucola normal  Genetic screening normal  Anatomy US normal   Prenatal Transfer Tool  Maternal Diabetes: No Genetic Screening: Normal Maternal Ultrasounds/Referrals: Normal Fetal Ultrasounds or other Referrals:  None Maternal Substance  Abuse:  Yes:  Type: Smoker, Marijuana Significant Maternal Medications:  None Significant Maternal Lab Results: Group B Strep positive  Results for orders placed or performed during the hospital encounter of 06/15/21 (from the past 24 hour(s))  Type and screen   Collection Time: 06/15/21 10:50 AM  Result Value Ref Range   ABO/RH(D) A POS    Antibody Screen NEG    Sample Expiration      06/18/2021,2359 Performed at [facility], [place] [address] [place], [place] [address]   CBC   Collection Time: 06/15/21 10:51 AM  Result Value Ref Range   WBC 8.1 4.0 - 10.5 K/uL   RBC 4.22 3.87 - 5.11 MIL/uL   Hemoglobin 11.2 (L) 12.0 - 15.0 g/dL   HCT 34.0 (L) 36.0 - 46.0 %   MCV 80.6 80.0 - 100.0 fL   MCH 26.5 26.0 - 34.0 pg   MCHC 32.9 30.0 - 36.0 g/dL   RDW 13.4 11.5 - 15.5 %   Platelets 271 150 - 400 K/uL   nRBC 0.0 0.0 - 0.2 %    Patient Active Problem List   Diagnosis Date Noted   Polyhydramnios 06/15/2021   Asthma 05/24/2021   Marijuana use 11/24/2020   Hyperemesis affecting pregnancy, antepartum 11/24/2020    Assessment/Plan:  Gina Holmes is a 20 y.o. G2P0010 at [redacted]w[redacted]d here for IOL due to mild polyhydramnios.   #Labor: Started with cytotec, plan for serial cervical exams and FB  #Pain: Undecided, considering epidural  #FWB: Cat 1  #ID: GBS positive, PCN  #MOF: Formula feeding  #MOC: Inpatient nexplanon  #Circ: undecided   #History of THC/tobacco use: Consider SW consult pp.   #Mild polyhydramnios: AFI 29, otherwise normal anatomy and genetic screening. Plan for careful AROM if needed.   Patriciaann Clan, DO  06/15/2021, 12:22 PM

## 2021-06-15 NOTE — Progress Notes (Signed)
Labor Progress Note Gina Holmes is a 20 y.o. G2P0010 at [redacted]w[redacted]d presented for IOL d/t mild poly.   S: Doing well, just got out of the shower. Feeling some cramps after cytotec but no consistent contraction pattern  O:  BP 119/64   Pulse 82   Temp 98.5 F (36.9 C) (Oral)   Resp 16   Ht 5\' 1"  (1.549 m)   Wt 100.7 kg   LMP 09/15/2020   SpO2 100%   BMI 41.97 kg/m  EFM: 130/mod/15x15  CVE: Dilation: Closed Effacement (%): Thick Cervical Position: Posterior Station: Ballotable Presentation: Vertex (by u/s) Exam by:: Renard Matter, MD   A&P: 20 y.o. G2P0010 [redacted]w[redacted]d  #Labor: Minimal progression since last check, will dose cytotec buccal and attempt foley balloon. #Pain: Epidural  #FWB: Cat 1 #GBS positive PCN   Layla Barter, MD PGY-2

## 2021-06-15 NOTE — Progress Notes (Signed)
Labor Progress Note Gina Holmes is a 20 y.o. G2P0010 at 106w0d presented for IOL d/t mild poly.   S: Doing well, trying to rest.   O:  BP (!) 119/54   Pulse 68   Temp 98.5 F (36.9 C) (Oral)   Resp 18   Ht 5\' 1"  (1.549 m)   Wt 100.7 kg   LMP 09/15/2020   SpO2 100%   BMI 41.97 kg/m  EFM: 135/mod/15x15  CVE: Dilation: Closed (outer os 1cm) Effacement (%): Thick Cervical Position: Posterior Station: Ballotable Presentation: Vertex (by u/s) Exam by:: Dr. Higinio Plan   A&P: 20 y.o. G2P0010 [redacted]w[redacted]d  #Labor: Minimal progression since last check, previously called 1 cm however internal os still feels closed with very thick cervix. BSUS confirmed vertex. Placed second vaginal cytotec, plan to switch to buccal on next check. Hopeful for FB on next check as well, patient has been amenable.  #Pain: Epidural  #FWB: Cat 1 #GBS positive PCN   Patriciaann Clan, DO

## 2021-06-16 ENCOUNTER — Ambulatory Visit: Payer: Medicaid Other

## 2021-06-16 ENCOUNTER — Inpatient Hospital Stay (HOSPITAL_COMMUNITY): Payer: Medicaid Other | Admitting: Anesthesiology

## 2021-06-16 LAB — RPR: RPR Ser Ql: NONREACTIVE

## 2021-06-16 MED ORDER — FENTANYL-BUPIVACAINE-NACL 0.5-0.125-0.9 MG/250ML-% EP SOLN
12.0000 mL/h | EPIDURAL | Status: DC | PRN
  Administered 2021-06-16 – 2021-06-17 (×3): 12 mL/h via EPIDURAL
  Filled 2021-06-16 (×3): qty 250

## 2021-06-16 MED ORDER — PHENYLEPHRINE 40 MCG/ML (10ML) SYRINGE FOR IV PUSH (FOR BLOOD PRESSURE SUPPORT)
80.0000 ug | PREFILLED_SYRINGE | INTRAVENOUS | Status: DC | PRN
  Filled 2021-06-16: qty 10

## 2021-06-16 MED ORDER — EPHEDRINE 5 MG/ML INJ: 10.0000 mg | INTRAVENOUS | Status: DC | PRN

## 2021-06-16 MED ORDER — FENTANYL CITRATE (PF) 100 MCG/2ML IJ SOLN
50.0000 ug | INTRAMUSCULAR | Status: DC | PRN
Start: 2021-06-16 — End: 2021-06-17
  Administered 2021-06-16: 50 ug via INTRAVENOUS
  Administered 2021-06-16: 100 ug via INTRAVENOUS
  Filled 2021-06-16: qty 2

## 2021-06-16 MED ORDER — PHENYLEPHRINE 40 MCG/ML (10ML) SYRINGE FOR IV PUSH (FOR BLOOD PRESSURE SUPPORT)
80.0000 ug | PREFILLED_SYRINGE | INTRAVENOUS | Status: AC | PRN
  Administered 2021-06-16 (×3): 80 ug via INTRAVENOUS

## 2021-06-16 MED ORDER — LIDOCAINE HCL (PF) 1 % IJ SOLN
INTRAMUSCULAR | Status: DC | PRN
  Administered 2021-06-16 (×2): 4 mL via EPIDURAL

## 2021-06-16 MED ORDER — LACTATED RINGERS IV SOLN: 500.0000 mL | Freq: Once | INTRAVENOUS | Status: DC

## 2021-06-16 MED ORDER — OXYTOCIN-SODIUM CHLORIDE 30-0.9 UT/500ML-% IV SOLN
1.0000 m[IU]/min | INTRAVENOUS | Status: DC
  Administered 2021-06-16: 2 m[IU]/min via INTRAVENOUS
  Administered 2021-06-17: 4 m[IU]/min via INTRAVENOUS
  Filled 2021-06-16: qty 500

## 2021-06-16 MED ORDER — LACTATED RINGERS IV SOLN
500.0000 mL | Freq: Once | INTRAVENOUS | Status: AC
  Administered 2021-06-16: 500 mL via INTRAVENOUS

## 2021-06-16 MED ORDER — DIPHENHYDRAMINE HCL 50 MG/ML IJ SOLN: 12.5000 mg | INTRAMUSCULAR | Status: DC | PRN

## 2021-06-16 NOTE — Progress Notes (Signed)
Gina Holmes is a 20 y.o. G2P0010 at [redacted]w[redacted]d admitted for induction of labor due to mild polyhydramnios, AFI 29.  Subjective: Pt comfortable with epidural.  Family in room for support.  Objective: BP 105/69   Pulse 73   Temp 98.4 F (36.9 C) (Oral)   Resp 18   Ht 5\' 1"  (1.549 m)   Wt 100.7 kg   LMP 09/15/2020   SpO2 100%   BMI 41.97 kg/m  I/O last 3 completed shifts: In: 383.7 [I.V.:383.7] Out: -  No intake/output data recorded.  FHT:  FHR: 145 bpm, variability: moderate,  accelerations:  Present,  decelerations:  Present occasional lates that resolve spontaneously or with position change UC:   regular, every 2 minutes SVE:   Dilation: 4 Effacement (%): 50 Station: -3 Exam by:: Thersa Salt, RN  Labs: Lab Results  Component Value Date   WBC 8.1 06/15/2021   HGB 11.2 (L) 06/15/2021   HCT 34.0 (L) 06/15/2021   MCV 80.6 06/15/2021   PLT 271 06/15/2021    Assessment / Plan: Induction of labor due to polyhydramnios,  progressing well on pitocin  Labor:  Cervix was unchanged on last exam but contractions frequent, pt with epidural for labor pain. Plan to recheck in 1-2 hours. If cervix remains thick, consider Cytotec dose for cervical ripening, if cervix effacing/dilating, continue IOL with Pitocin. Preeclampsia:   n/a Fetal Wellbeing:   Overall category I with episodes of Category II Pain Control:  Epidural I/D:   GBS positive on PCN Anticipated MOD:  NSVD  Fatima Blank 06/16/2021, 9:15 AM

## 2021-06-16 NOTE — Anesthesia Preprocedure Evaluation (Signed)
Anesthesia Evaluation  Patient identified by MRN, date of birth, ID band Patient awake    Reviewed: Allergy & Precautions, Patient's Chart, lab work & pertinent test results  History of Anesthesia Complications Negative for: history of anesthetic complications  Airway Mallampati: II  TM Distance: >3 FB Neck ROM: Full    Dental no notable dental hx.    Pulmonary asthma , former smoker,    Pulmonary exam normal        Cardiovascular negative cardio ROS Normal cardiovascular exam     Neuro/Psych negative neurological ROS  negative psych ROS   GI/Hepatic negative GI ROS, Neg liver ROS,   Endo/Other  Morbid obesity  Renal/GU negative Renal ROS  negative genitourinary   Musculoskeletal negative musculoskeletal ROS (+)   Abdominal   Peds  Hematology negative hematology ROS (+)   Anesthesia Other Findings Day of surgery medications reviewed with patient.  Reproductive/Obstetrics (+) Pregnancy                             Anesthesia Physical Anesthesia Plan  ASA: 3  Anesthesia Plan: Epidural   Post-op Pain Management:    Induction:   PONV Risk Score and Plan: Treatment may vary due to age or medical condition  Airway Management Planned: Natural Airway  Additional Equipment: Fetal Monitoring  Intra-op Plan:   Post-operative Plan:   Informed Consent: I have reviewed the patients History and Physical, chart, labs and discussed the procedure including the risks, benefits and alternatives for the proposed anesthesia with the patient or authorized representative who has indicated his/her understanding and acceptance.       Plan Discussed with:   Anesthesia Plan Comments:         Anesthesia Quick Evaluation

## 2021-06-16 NOTE — Progress Notes (Signed)
Labor Progress Note Gina Holmes is a 20 y.o. G2P0010 at [redacted]w[redacted]d presented for IOL d/t mild poly.   S: Patient feeling contractions more frequently and painful. Balloon is very irritating.   O:  BP (!) 119/52   Pulse 65   Temp 98.6 F (37 C) (Oral)   Resp 16   Ht 5\' 1"  (1.549 m)   Wt 100.7 kg   LMP 09/15/2020   SpO2 100%   BMI 41.97 kg/m  EFM: 130/mod/15x15, no decels  CVE: Dilation: 4 Effacement (%): 30 Cervical Position: Posterior Station: -3 Presentation: Vertex (by u/s) Exam by:: Das   A&P: 20 y.o. G2P0010 [redacted]w[redacted]d  #Labor: FB placed. Called to the room about 45 min after placement as patient was in a lot of pain. Made change from 1-4 cm. FB was removed as it was very irritating to patient. Has a regular contraction pattern every 2-3 min. Can consider pitocin vs AROM if contractions space out.  #Pain: Epidural on request #FWB: Cat 1 #GBS positive PCN   Layla Barter, MD PGY-2

## 2021-06-16 NOTE — Progress Notes (Signed)
Gina Holmes is a 20 y.o. G2P0010 at [redacted]w[redacted]d admitted for IOL for mild polyhydramnios.  Subjective: Pt comfortable with epidural. Family in room for support.  Objective: BP 94/65   Pulse 85   Temp 98.7 F (37.1 C) (Oral)   Resp 17   Ht 5\' 1"  (1.549 m)   Wt 100.7 kg   LMP 09/15/2020   SpO2 100%   BMI 41.97 kg/m  I/O last 3 completed shifts: In: 383.7 [I.V.:383.7] Out: -  No intake/output data recorded.  FHT:  FHR: 135 bpm, variability: moderate,  accelerations:  Present,  decelerations:  Absent UC:   regular, every 3 minutes SVE:   Dilation: 4 Effacement (%): 50 Station: -2 Exam by:: Fatima Blank, CNM BBOW with contractions AROM with copious amount of clear fluid, IUPC placed without difficulty. Pt tolerated well.   Labs: Lab Results  Component Value Date   WBC 8.1 06/15/2021   HGB 11.2 (L) 06/15/2021   HCT 34.0 (L) 06/15/2021   MCV 80.6 06/15/2021   PLT 271 06/15/2021    Assessment / Plan: Induction of labor due to polyhydramnios,  progressing well on pitocin  Labor:  No cervical change x 5-6 hours, since foley balloon, despite frequent contractions.  Fetal head well applied and BBOW with contractions so AROM with IUPC for improved titration of Pitocin. Preeclampsia:   n/a Fetal Wellbeing:  Category I Pain Control:  Epidural I/D:   GBS positive on PCN Anticipated MOD:  NSVD  Fatima Blank 06/16/2021, 12:50 PM

## 2021-06-16 NOTE — Anesthesia Procedure Notes (Signed)

## 2021-06-16 NOTE — Progress Notes (Signed)
Gina Holmes is a 20 y.o. G2P0010 at [redacted]w[redacted]d admitted for induction of labor due to polyhydramnios.  Subjective: Pt comfortable with epidural. Family in room for support.  Objective: BP 104/61 (BP Location: Right Arm)   Pulse 89   Temp 98.4 F (36.9 C) (Axillary)   Resp 19   Ht 5\' 1"  (1.549 m)   Wt 100.7 kg   LMP 09/15/2020   SpO2 100%   BMI 41.97 kg/m  I/O last 3 completed shifts: In: 383.7 [I.V.:383.7] Out: -  No intake/output data recorded.  FHT:  FHR: 135 bpm, variability: moderate,  accelerations:  Present,  decelerations:  Absent UC:   regular, every 3-4 minutes SVE:   Deferred  Labs: Lab Results  Component Value Date   WBC 8.1 06/15/2021   HGB 11.2 (L) 06/15/2021   HCT 34.0 (L) 06/15/2021   MCV 80.6 06/15/2021   PLT 271 06/15/2021    Assessment / Plan: Induction of labor due to polyhydramnios,  progressing well on pitocin  Labor:  IV infiltrated and difficulty restarting IV. Pitocin off at this time with no IV.  Plan to restart Pitocin when IV replaced.  Will check cervix with Pitocin restart.  Preeclampsia:   n/a Fetal Wellbeing:  Category I Pain Control:  Epidural I/D:   GBS positive on PCN Anticipated MOD:  NSVD  Fatima Blank 06/16/2021, 5:33 PM

## 2021-06-16 NOTE — Progress Notes (Signed)
Patient Vitals for the past 4 hrs:  BP Temp Temp src Pulse Resp  06/16/21 2044 (!) 92/44 -- -- 66 16  06/16/21 2031 (!) 77/31 -- -- (!) 56 18  06/16/21 2001 (!) 98/57 -- -- (!) 56 18  06/16/21 1940 (!) 93/46 -- -- (!) 58 16  06/16/21 1935 -- 97.8 F (36.6 C) Oral -- --  06/16/21 1856 (!) 96/44 -- -- (!) 59 --  06/16/21 1803 (!) 84/64 -- -- 60 --  06/16/21 1731 104/61 98.4 F (36.9 C) Axillary 89 19  06/16/21 1726 96/65 -- -- 73 --   IV finally in, cx 4/70/-2/vtx   FHR Cat 1, minimal uterine activity.  Will restart pitocin.

## 2021-06-16 NOTE — Progress Notes (Signed)
Labor Progress Note Gina Holmes is a 20 y.o. G2P0010 at [redacted]w[redacted]d presented for IOL d/t mild poly.   S: Patient is comfortable, resting.   O:  BP (!) 119/52   Pulse 65   Temp 98 F (36.7 C) (Oral)   Resp 16   Ht 5\' 1"  (1.549 m)   Wt 100.7 kg   LMP 09/15/2020   SpO2 100%   BMI 41.97 kg/m  EFM: 130/mod/15x15, no decels  CVE: Dilation: 4 Effacement (%): 50 Cervical Position: Posterior Station: -3 Presentation: Vertex (by u/s) Exam by:: Renard Matter, MD  A&P: 20 y.o. G2P0010 [redacted]w[redacted]d  #Labor: CTX pattern has spaced out, will start pitocin.  #Pain: Epidural on request #FWB: Cat 1 #GBS positive PCN   Layla Barter, MD PGY-2

## 2021-06-17 ENCOUNTER — Encounter (HOSPITAL_COMMUNITY)
Admission: AD | Disposition: A | Discharge status: Home / Self Care | Payer: Self-pay | Source: Home / Self Care | Attending: Family Medicine

## 2021-06-17 ENCOUNTER — Encounter (HOSPITAL_COMMUNITY): Payer: Self-pay | Admitting: Family Medicine

## 2021-06-17 DIAGNOSIS — O99824 Streptococcus B carrier state complicating childbirth: Secondary | ICD-10-CM

## 2021-06-17 DIAGNOSIS — Z3A39 39 weeks gestation of pregnancy: Secondary | ICD-10-CM

## 2021-06-17 DIAGNOSIS — O403XX0 Polyhydramnios, third trimester, not applicable or unspecified: Secondary | ICD-10-CM

## 2021-06-17 SURGERY — Surgical Case
Anesthesia: Epidural

## 2021-06-17 MED ORDER — ONDANSETRON HCL 4 MG/2ML IJ SOLN
INTRAMUSCULAR | Status: DC | PRN
  Administered 2021-06-17: 4 mg via INTRAVENOUS

## 2021-06-17 MED ORDER — ENOXAPARIN SODIUM 60 MG/0.6ML IJ SOSY
50.0000 mg | PREFILLED_SYRINGE | INTRAMUSCULAR | Status: DC
  Administered 2021-06-18 – 2021-06-19 (×2): 50 mg via SUBCUTANEOUS
  Filled 2021-06-17 (×2): qty 0.6

## 2021-06-17 MED ORDER — CEFAZOLIN SODIUM-DEXTROSE 2-4 GM/100ML-% IV SOLN: 2.0000 g | INTRAVENOUS | Status: DC

## 2021-06-17 MED ORDER — SODIUM CHLORIDE 0.9 % IV SOLN
INTRAVENOUS | Status: AC
  Filled 2021-06-17: qty 5

## 2021-06-17 MED ORDER — DIPHENHYDRAMINE HCL 25 MG PO CAPS: 25.0000 mg | ORAL_CAPSULE | Freq: Four times a day (QID) | ORAL | Status: DC | PRN

## 2021-06-17 MED ORDER — COCONUT OIL OIL: 1.0000 "application " | TOPICAL_OIL | Status: DC | PRN

## 2021-06-17 MED ORDER — MORPHINE SULFATE (PF) 0.5 MG/ML IJ SOLN
INTRAMUSCULAR | Status: DC | PRN
  Administered 2021-06-17: 3 mg via EPIDURAL

## 2021-06-17 MED ORDER — MAGNESIUM HYDROXIDE 400 MG/5ML PO SUSP: 30.0000 mL | ORAL | Status: DC | PRN

## 2021-06-17 MED ORDER — DIPHENHYDRAMINE HCL 50 MG/ML IJ SOLN: 12.5000 mg | INTRAMUSCULAR | Status: DC | PRN

## 2021-06-17 MED ORDER — SIMETHICONE 80 MG PO CHEW: 80.0000 mg | CHEWABLE_TABLET | ORAL | Status: DC | PRN

## 2021-06-17 MED ORDER — PRENATAL MULTIVITAMIN CH
1.0000 | ORAL_TABLET | Freq: Every day | ORAL | Status: DC
  Administered 2021-06-18 – 2021-06-19 (×2): 1 via ORAL
  Filled 2021-06-17 (×2): qty 1

## 2021-06-17 MED ORDER — CALCIUM CARBONATE ANTACID 500 MG PO CHEW
400.0000 mg | CHEWABLE_TABLET | Freq: Once | ORAL | Status: AC
  Administered 2021-06-17: 400 mg via ORAL
  Filled 2021-06-17: qty 2

## 2021-06-17 MED ORDER — IBUPROFEN 600 MG PO TABS
600.0000 mg | ORAL_TABLET | Freq: Four times a day (QID) | ORAL | Status: DC
  Administered 2021-06-18 – 2021-06-19 (×3): 600 mg via ORAL
  Filled 2021-06-17 (×3): qty 1

## 2021-06-17 MED ORDER — OXYTOCIN-SODIUM CHLORIDE 30-0.9 UT/500ML-% IV SOLN
INTRAVENOUS | Status: AC
  Filled 2021-06-17: qty 500

## 2021-06-17 MED ORDER — DIPHENHYDRAMINE HCL 25 MG PO CAPS: 25.0000 mg | ORAL_CAPSULE | ORAL | Status: DC | PRN

## 2021-06-17 MED ORDER — MEPERIDINE HCL 25 MG/ML IJ SOLN: 6.2500 mg | INTRAMUSCULAR | Status: DC | PRN

## 2021-06-17 MED ORDER — NALOXONE HCL 0.4 MG/ML IJ SOLN: 0.4000 mg | INTRAMUSCULAR | Status: DC | PRN

## 2021-06-17 MED ORDER — GABAPENTIN 100 MG PO CAPS
100.0000 mg | ORAL_CAPSULE | Freq: Every day | ORAL | Status: DC
  Administered 2021-06-17 – 2021-06-18 (×2): 100 mg via ORAL
  Filled 2021-06-17 (×2): qty 1

## 2021-06-17 MED ORDER — TETANUS-DIPHTH-ACELL PERTUSSIS 5-2.5-18.5 LF-MCG/0.5 IM SUSY: 0.5000 mL | PREFILLED_SYRINGE | Freq: Once | INTRAMUSCULAR | Status: DC

## 2021-06-17 MED ORDER — OXYCODONE HCL 5 MG PO TABS
5.0000 mg | ORAL_TABLET | ORAL | Status: DC | PRN
  Administered 2021-06-19: 5 mg via ORAL
  Filled 2021-06-17: qty 1

## 2021-06-17 MED ORDER — MORPHINE SULFATE (PF) 0.5 MG/ML IJ SOLN
INTRAMUSCULAR | Status: AC
  Filled 2021-06-17: qty 10

## 2021-06-17 MED ORDER — KETOROLAC TROMETHAMINE 30 MG/ML IJ SOLN
INTRAMUSCULAR | Status: AC
  Filled 2021-06-17: qty 1

## 2021-06-17 MED ORDER — BUPIVACAINE HCL 0.25 % IJ SOLN
INTRAMUSCULAR | Status: DC | PRN
  Administered 2021-06-17: 10 mL
  Administered 2021-06-17: 20 mL

## 2021-06-17 MED ORDER — MEASLES, MUMPS & RUBELLA VAC IJ SOLR: 0.5000 mL | Freq: Once | INTRAMUSCULAR | Status: DC

## 2021-06-17 MED ORDER — MENTHOL 3 MG MT LOZG: 1.0000 | LOZENGE | OROMUCOSAL | Status: DC | PRN

## 2021-06-17 MED ORDER — CEFAZOLIN SODIUM-DEXTROSE 2-4 GM/100ML-% IV SOLN
INTRAVENOUS | Status: AC
  Filled 2021-06-17: qty 100

## 2021-06-17 MED ORDER — SODIUM CHLORIDE 0.9 % IV SOLN: 500.0000 mg | INTRAVENOUS | Status: DC

## 2021-06-17 MED ORDER — PHENYLEPHRINE 40 MCG/ML (10ML) SYRINGE FOR IV PUSH (FOR BLOOD PRESSURE SUPPORT)
PREFILLED_SYRINGE | INTRAVENOUS | Status: DC | PRN
  Administered 2021-06-17 (×3): 40 ug via INTRAVENOUS

## 2021-06-17 MED ORDER — SODIUM CHLORIDE 0.9% FLUSH: 3.0000 mL | INTRAVENOUS | Status: DC | PRN

## 2021-06-17 MED ORDER — MEDROXYPROGESTERONE ACETATE 150 MG/ML IM SUSP: 150.0000 mg | INTRAMUSCULAR | Status: DC | PRN

## 2021-06-17 MED ORDER — PHENYLEPHRINE 40 MCG/ML (10ML) SYRINGE FOR IV PUSH (FOR BLOOD PRESSURE SUPPORT)
PREFILLED_SYRINGE | INTRAVENOUS | Status: AC
  Filled 2021-06-17: qty 10

## 2021-06-17 MED ORDER — LIDOCAINE-EPINEPHRINE (PF) 2 %-1:200000 IJ SOLN
INTRAMUSCULAR | Status: DC | PRN
  Administered 2021-06-17: 10 mL via EPIDURAL

## 2021-06-17 MED ORDER — FENTANYL CITRATE (PF) 100 MCG/2ML IJ SOLN: 25.0000 ug | INTRAMUSCULAR | Status: DC | PRN

## 2021-06-17 MED ORDER — NALOXONE HCL 4 MG/10ML IJ SOLN
1.0000 ug/kg/h | INTRAVENOUS | Status: DC | PRN
  Filled 2021-06-17: qty 5

## 2021-06-17 MED ORDER — DEXAMETHASONE SODIUM PHOSPHATE 10 MG/ML IJ SOLN
INTRAMUSCULAR | Status: AC
  Filled 2021-06-17: qty 1

## 2021-06-17 MED ORDER — LACTATED RINGERS IV SOLN: INTRAVENOUS | Status: DC

## 2021-06-17 MED ORDER — KETOROLAC TROMETHAMINE 30 MG/ML IJ SOLN: 30.0000 mg | Freq: Four times a day (QID) | INTRAMUSCULAR | Status: DC | PRN

## 2021-06-17 MED ORDER — DIBUCAINE (PERIANAL) 1 % EX OINT: 1.0000 "application " | TOPICAL_OINTMENT | CUTANEOUS | Status: DC | PRN

## 2021-06-17 MED ORDER — SOD CITRATE-CITRIC ACID 500-334 MG/5ML PO SOLN: 30.0000 mL | ORAL | Status: DC

## 2021-06-17 MED ORDER — STERILE WATER FOR IRRIGATION IR SOLN
Status: DC | PRN
  Administered 2021-06-17: 1

## 2021-06-17 MED ORDER — ONDANSETRON HCL 4 MG/2ML IJ SOLN
INTRAMUSCULAR | Status: AC
  Filled 2021-06-17: qty 2

## 2021-06-17 MED ORDER — OXYTOCIN-SODIUM CHLORIDE 30-0.9 UT/500ML-% IV SOLN
2.5000 [IU]/h | INTRAVENOUS | Status: AC
  Administered 2021-06-17: 2.5 [IU]/h via INTRAVENOUS
  Filled 2021-06-17: qty 500

## 2021-06-17 MED ORDER — DEXAMETHASONE SODIUM PHOSPHATE 10 MG/ML IJ SOLN
INTRAMUSCULAR | Status: DC | PRN
  Administered 2021-06-17: 10 mg via INTRAVENOUS

## 2021-06-17 MED ORDER — KETOROLAC TROMETHAMINE 30 MG/ML IJ SOLN
30.0000 mg | Freq: Four times a day (QID) | INTRAMUSCULAR | Status: DC | PRN
  Administered 2021-06-17: 30 mg via INTRAVENOUS

## 2021-06-17 MED ORDER — SENNOSIDES-DOCUSATE SODIUM 8.6-50 MG PO TABS
2.0000 | ORAL_TABLET | Freq: Every day | ORAL | Status: DC
  Administered 2021-06-18: 2 via ORAL
  Filled 2021-06-17: qty 2

## 2021-06-17 MED ORDER — WITCH HAZEL-GLYCERIN EX PADS: 1.0000 "application " | MEDICATED_PAD | CUTANEOUS | Status: DC | PRN

## 2021-06-17 MED ORDER — KETOROLAC TROMETHAMINE 30 MG/ML IJ SOLN
30.0000 mg | Freq: Four times a day (QID) | INTRAMUSCULAR | Status: AC
  Administered 2021-06-18 (×3): 30 mg via INTRAVENOUS
  Filled 2021-06-17 (×3): qty 1

## 2021-06-17 MED ORDER — OXYTOCIN-SODIUM CHLORIDE 30-0.9 UT/500ML-% IV SOLN
INTRAVENOUS | Status: DC | PRN
  Administered 2021-06-17: 30 [IU] via INTRAVENOUS

## 2021-06-17 MED ORDER — BUPIVACAINE HCL (PF) 0.25 % IJ SOLN
INTRAMUSCULAR | Status: AC
  Filled 2021-06-17: qty 30

## 2021-06-17 MED ORDER — ACETAMINOPHEN 500 MG PO TABS
1000.0000 mg | ORAL_TABLET | Freq: Four times a day (QID) | ORAL | Status: DC
  Administered 2021-06-17 – 2021-06-19 (×7): 1000 mg via ORAL
  Filled 2021-06-17 (×7): qty 2

## 2021-06-17 MED ORDER — SIMETHICONE 80 MG PO CHEW
80.0000 mg | CHEWABLE_TABLET | Freq: Three times a day (TID) | ORAL | Status: DC
  Administered 2021-06-18 (×3): 80 mg via ORAL
  Filled 2021-06-17 (×4): qty 1

## 2021-06-17 MED ORDER — SODIUM CHLORIDE 0.9 % IR SOLN
Status: DC | PRN
  Administered 2021-06-17: 1

## 2021-06-17 MED ORDER — ONDANSETRON HCL 4 MG/2ML IJ SOLN
4.0000 mg | Freq: Three times a day (TID) | INTRAMUSCULAR | Status: DC | PRN
  Administered 2021-06-18: 4 mg via INTRAVENOUS
  Filled 2021-06-17: qty 2

## 2021-06-17 MED ORDER — LIDOCAINE-EPINEPHRINE (PF) 2 %-1:200000 IJ SOLN
INTRAMUSCULAR | Status: AC
  Filled 2021-06-17: qty 20

## 2021-06-17 SURGICAL SUPPLY — 31 items
BENZOIN TINCTURE PRP APPL 2/3 (GAUZE/BANDAGES/DRESSINGS) ×2 IMPLANT
CLAMP CORD UMBIL (MISCELLANEOUS) ×2 IMPLANT
CLOTH BEACON ORANGE TIMEOUT ST (SAFETY) ×2 IMPLANT
DRSG OPSITE POSTOP 4X10 (GAUZE/BANDAGES/DRESSINGS) ×2 IMPLANT
ELECT REM PT RETURN 9FT ADLT (ELECTROSURGICAL) ×2
ELECTRODE REM PT RTRN 9FT ADLT (ELECTROSURGICAL) ×1 IMPLANT
EXTRACTOR VACUUM M CUP 4 TUBE (SUCTIONS) IMPLANT
GAUZE SPONGE 4X4 12PLY STRL LF (GAUZE/BANDAGES/DRESSINGS) ×4 IMPLANT
GLOVE BIOGEL PI IND STRL 7.0 (GLOVE) ×3 IMPLANT
GLOVE BIOGEL PI INDICATOR 7.0 (GLOVE) ×3
GLOVE ECLIPSE 7.0 STRL STRAW (GLOVE) ×2 IMPLANT
GOWN STRL REUS W/TWL LRG LVL3 (GOWN DISPOSABLE) ×4 IMPLANT
HOVERMATT SINGLE USE (MISCELLANEOUS) ×2 IMPLANT
KIT ABG SYR 3ML LUER SLIP (SYRINGE) ×2 IMPLANT
NEEDLE HYPO 22GX1.5 SAFETY (NEEDLE) ×2 IMPLANT
NEEDLE HYPO 25X5/8 SAFETYGLIDE (NEEDLE) ×2 IMPLANT
NS IRRIG 1000ML POUR BTL (IV SOLUTION) ×2 IMPLANT
PACK C SECTION WH (CUSTOM PROCEDURE TRAY) ×2 IMPLANT
PAD ABD 7.5X8 STRL (GAUZE/BANDAGES/DRESSINGS) ×2 IMPLANT
PAD OB MATERNITY 4.3X12.25 (PERSONAL CARE ITEMS) ×2 IMPLANT
RTRCTR C-SECT PINK 25CM LRG (MISCELLANEOUS) ×2 IMPLANT
STRIP CLOSURE SKIN 1/2X4 (GAUZE/BANDAGES/DRESSINGS) ×2 IMPLANT
SUT MNCRL 0 VIOLET CTX 36 (SUTURE) ×2 IMPLANT
SUT MONOCRYL 0 CTX 36 (SUTURE) ×2
SUT VIC AB 0 CTX 36 (SUTURE) ×1
SUT VIC AB 0 CTX36XBRD ANBCTRL (SUTURE) ×1 IMPLANT
SUT VIC AB 4-0 KS 27 (SUTURE) ×2 IMPLANT
SYR 30ML LL (SYRINGE) ×2 IMPLANT
TOWEL OR 17X24 6PK STRL BLUE (TOWEL DISPOSABLE) ×2 IMPLANT
TRAY FOLEY W/BAG SLVR 14FR LF (SET/KITS/TRAYS/PACK) ×2 IMPLANT
WATER STERILE IRR 1000ML POUR (IV SOLUTION) ×2 IMPLANT

## 2021-06-17 NOTE — Discharge Summary (Signed)
Postpartum Discharge Summary    Patient Name: Gina Holmes DOB: 03-Feb-2001 MRN: [id number]  Date of admission: 06/15/2021 Delivery date:06/17/2021  Delivering provider: Donnamae Jude  Date of discharge: 06/19/2021  Admitting diagnosis: Polyhydramnios [O40.9XX0] Intrauterine pregnancy: [redacted]w[redacted]d    Secondary diagnosis:  Principal Problem:   Cesarean delivery, delivered, current hospitalization Active Problems:   Polyhydramnios   Arrest of dilation, delivered, current hospitalization   Anemia due to blood loss, acute  Additional problems: None    Discharge diagnosis: Term Pregnancy Delivered                                              Post partum procedures: None Augmentation: AROM, Pitocin, Cytotec, and IP Foley Complications: RPQA>44hours  Hospital course: Induction of Labor With Cesarean Section   20y.o. yo G2P1011 at 362w2das admitted to the hospital 06/15/2021 for induction of labor. Patient had a labor course significant for prolonged induction, unchanged at 8cm for several hours associated with maternal exhaustion. The patient went for cesarean section due to Arrest of Dilation. Delivery details are as follows: Membrane Rupture Time/Date: 12:33 PM ,06/16/2021   Delivery Method:C-Section, Low Transverse  Details of operation can be found in separate operative Note.  Patient had an uncomplicated postpartum course. She is ambulating, tolerating a regular diet, passing flatus, and urinating well. Nexplanon was placed 06/19/21. See procedure note. Patient is discharged home in stable condition on 06/19/21.      Newborn Data: Birth date:06/17/2021  Birth time:6:16 PM  Gender:Female  Living status:Living  Apgars:8 ,9  Weight:3690 g                                Magnesium Sulfate received: No BMZ received: No Rhophylac:No MMR:No T-DaP:Given prenatally Flu: Yes Transfusion:No  Physical exam  Vitals:   06/18/21 0930 06/18/21 1513 06/18/21 2145 06/19/21 0602  BP: (!)  110/56  113/67 114/62  Pulse: (!) 55  78 60  Resp: 18  17   Temp: 98.1 F (36.7 C)  98.9 F (37.2 C) 98.1 F (36.7 C)  TempSrc: Oral  Oral   SpO2:  100% 100% 100%  Weight:      Height:       General: alert, cooperative, and no distress Lochia: appropriate Uterine Fundus: firm Incision: Dressing is clean, dry, and intact DVT Evaluation: No evidence of DVT seen on physical exam. Labs: Lab Results  Component Value Date   WBC 21.7 (H) 06/18/2021   HGB 9.9 (L) 06/18/2021   HCT 29.1 (L) 06/18/2021   MCV 81.5 06/18/2021   PLT 232 06/18/2021   CMP Latest Ref Rng & Units 05/24/2021  Glucose 70 - 99 mg/dL 88  BUN 6 - 20 mg/dL <5(L)  Creatinine 0.44 - 1.00 mg/dL 0.38(L)  Sodium 135 - 145 mmol/L 132(L)  Potassium 3.5 - 5.1 mmol/L 3.7  Chloride 98 - 111 mmol/L 105  CO2 22 - 32 mmol/L 18(L)  Calcium 8.9 - 10.3 mg/dL 8.3(L)  Total Protein 6.5 - 8.1 g/dL 6.0(L)  Total Bilirubin 0.3 - 1.2 mg/dL 0.3  Alkaline Phos 38 - 126 U/L 140(H)  AST 15 - 41 U/L 19  ALT 0 - 44 U/L 15   Edinburgh Score: Edinburgh Postnatal Depression Scale Screening Tool 06/17/2021  I have been able to  laugh and see the funny side of things. 0  I have looked forward with enjoyment to things. 0  I have blamed myself unnecessarily when things went wrong. 1  I have been anxious or worried for no good reason. 1  I have felt scared or panicky for no good reason. 1  Things have been getting on top of me. 0  I have been so unhappy that I have had difficulty sleeping. 0  I have felt sad or miserable. 1  I have been so unhappy that I have been crying. 1  The thought of harming myself has occurred to me. 0  Edinburgh Postnatal Depression Scale Total 5     After visit meds:  Allergies as of 06/19/2021       Reactions   [employer], Shortness Of Breath   [employer], Shortness Of Breath   Apple Hives   "RED APPLES" itchy throat also   Peach Flavor Hives   Pear Hives        Medication List      TAKE these medications    acetaminophen 500 MG tablet Commonly known as: TYLENOL Take 2 tablets (1,000 mg total) by mouth every 6 (six) hours as needed for moderate pain, fever or headache.   ibuprofen 600 MG tablet Commonly known as: ADVIL Take 1 tablet (600 mg total) by mouth every 6 (six) hours.   Ipratropium-Albuterol 20-100 MCG/ACT Aers respimat Commonly known as: COMBIVENT Inhale 1 puff into the lungs every 6 (six) hours. What changed:  when to take this reasons to take this   oxyCODONE 5 MG immediate release tablet Commonly known as: Oxy IR/ROXICODONE Take 1-2 tablets (5-10 mg total) by mouth every 6 (six) hours as needed for moderate pain.   polyethylene glycol powder 17 GM/SCOOP powder Commonly known as: GLYCOLAX/MIRALAX Take 17 g by mouth daily as needed for moderate constipation.   Prenatal Vitamin Plus Low Iron 27-1 MG Tabs Take 1 tablet by mouth daily.         Discharge home in stable condition Infant Feeding: Breast Infant Disposition:home with mother Discharge instruction: per After Visit Summary and Postpartum booklet. Activity: Advance as tolerated. Pelvic rest for 6 weeks.  Diet: iron rich diet Future Appointments:No future appointments. Follow up Visit:  Message sent to [facility] by Dr Higinio Plan for incision check, patient will need to schedule her postpartum visit through [facility].   Additional Postpartum F/U:Incision check 1 week  High risk pregnancy complicated by:  mild poly, hyperemesis Delivery mode:  C-Section, Low Transverse  Anticipated Birth Control:  [place], [place], [place], [place] 06/19/2021 2:23 PM

## 2021-06-17 NOTE — Transfer of Care (Signed)
Immediate Anesthesia Transfer of Care Note  Patient: Gina Holmes  Procedure(s) Performed: CESAREAN SECTION  Patient Location: PACU  Anesthesia Type:Epidural  Level of Consciousness: awake, alert  and oriented  Airway & Oxygen Therapy: Patient Spontanous Breathing  Post-op Assessment: Report given to RN and Post -op Vital signs reviewed and stable  Post vital signs: Reviewed and stable  Last Vitals:  Vitals Value Taken Time  BP 116/60 06/17/21 1911  Temp    Pulse 95 06/17/21 1914  Resp 14 06/17/21 1914  SpO2 97 % 06/17/21 1914  Vitals shown include unvalidated device data.  Last Pain:  Vitals:   06/17/21 1700  TempSrc: Oral  PainSc:          Complications: No notable events documented.

## 2021-06-17 NOTE — Progress Notes (Signed)
Gina Holmes is a 20 y.o. G2P0010 at [redacted]w[redacted]d admitted for induction of labor due to polyhydramnios.  Subjective: Patient feeling more pressure and feels like she needs to have a bowel movement.  Objective: BP 125/64   Pulse 73   Temp 98.4 F (36.9 C) (Oral)   Resp 16   Ht 5\' 1"  (1.549 m)   Wt 100.7 kg   LMP 09/15/2020   SpO2 100%   BMI 41.97 kg/m  I/O last 3 completed shifts: In: 383.7 [I.V.:383.7] Out: -  Total I/O In: -  Out: 950 [Urine:950]  FHT:  FHR: 130 bpm, variability: moderate,  accelerations:  Present,  decelerations:  Absent UC:   every 4-5 minutes Dilation: 5 Effacement (%): 70 Cervical Position: Middle Station: -1, 0 Presentation: Vertex Exam by:: Dr Jaclynn Guarneri   Labs: Lab Results  Component Value Date   WBC 8.1 06/15/2021   HGB 11.2 (L) 06/15/2021   HCT 34.0 (L) 06/15/2021   MCV 80.6 06/15/2021   PLT 271 06/15/2021   Assessment / Plan: Induction of labor due to polyhydramnios,  progressing well on pitocin  Labor: Pit @ 16. MVU not adequate. Will continue to titrate pit to adequate contraction pattern.  Fetal Wellbeing:  Category I Pain Control:  Epidural I/D:   GBS positive on PCN Anticipated MOD:  NSVD  Layla Barter PGY-2 06/17/2021, 2:56 AM

## 2021-06-17 NOTE — Op Note (Signed)
C-section Operative Note   Preoperative Diagnosis:  IUP @ [redacted]w[redacted]d, arrest of dilation   Postoperative Diagnosis:  Same  Procedure: Primary low transverse cesarean section  Surgeon: Darron Doom, M.D.  Assistant: Darrelyn Hillock, DO   Anesthesia: Josephine Igo, MD  Findings: Viable female infant in direct OP position APGAR (1 MIN): 8   APGAR (5 MINS): 9   Weight 8lbs 2 oz, Normal tubes and ovaries  Estimated blood loss: 175ZW  Complications: None known  Specimens: Placenta to labor and delivery  Reason for procedure: Briefly, the patient is a 20 y.o. G2P1011 at [redacted]w[redacted]d presenting for a primary c-section after arrest of dilation for several hours at 8cm and associated maternal exhaustion. The risks of cesarean section discussed with the patient included but were not limited to: bleeding which may require transfusion or reoperation; infection which may require antibiotics; injury to bowel, bladder, ureters or other surrounding organs; injury to the fetus; need for additional procedures including hysterectomy in the event of a life-threatening hemorrhage; placental abnormalities wth subsequent pregnancies, incisional problems, thromboembolic phenomenon and other postoperative/anesthesia complications. The patient concurred with the proposed plan, giving informed written consent for the procedure.     Procedure: Patient is a to the OR where epidural analgesia was found to be adequate. She was then placed in a supine position with left lateral tilt. She received 2 g of Ancef, Azithromycin, and SCDs were in place. A Foley catheter was already in place in the bladder. She was prepped and draped in the usual sterile fashion. A timeout was performed. A knife was then used to make a Pfannenstiel incision. This incision was carried out to underlying fascia which was divided in the midline with the knife. The incision was extended laterally bluntly. The fascia was dissected of the underlying rectus  superiorly.  The rectus was divided in the midline.  The peritoneal cavity was entered bluntly.  Alexis retractor was placed inside the incision.  A knife was used to make a low transverse incision on the uterus. This incision was carried down to the amniotic cavity was entered. Fetus was in direct OP position and was brought up out of the incision without difficulty. Cord was clamped x 2 and cut. Infant taken to waiting pediatrician.  Cord blood was obtained. Placenta was delivered from the uterus.  Uterus was cleaned with dry lap pads. Uterine incision closed with 0 Monocryl suture in a locked running fashion and an additional imbricating layer was placed. A figure of eight stitch was used for hemostasis.   Alexis retractor was removed from the abdomen. Peritoneal closure was done with 0 Monocryl suture. There was bleeding from the left rectus and electrocautery was used to obtain hemostasis.  Fascia was closed with 0 Vicryl suture in a running fashion. Subcutaneous tissue infused with 30cc 0.25% Marcaine.  Skin closed using 4-0 Vicryl on a Keith needle.  Honeycomb dressing followed by pressure dressing was applied.  All instrument, needle and lap counts were correct x 2.  Patient was awake and taken to PACU stable. Infant to Newborn Nursery, stable.  Patriciaann Clan, DO  OB Fellow

## 2021-06-17 NOTE — Progress Notes (Signed)
Labor Progress Note Jen Benedict is a 20 y.o. G2P0010 at [redacted]w[redacted]d presented for IOL d/t polyhydramnios  S: Doing well, resting comfortably. Has questions about labor plan for today.   O:  BP 125/67   Pulse 77   Temp 98.5 F (36.9 C) (Oral)   Resp 16   Ht 5\' 1"  (1.549 m)   Wt 100.7 kg   LMP 09/15/2020   SpO2 100%   BMI 41.97 kg/m  EFM: 135/mod/10x10/occasional lates  CVE: Dilation: 5 Effacement (%): 80 Cervical Position: Middle Station: -1 Presentation: Vertex Exam by:: Ileana Ladd RN   A&P: 20 y.o. G2P0010 [redacted]w[redacted]d  #Labor: Unchanged CVE. FHT as above. Pit @ 24 with inadequate MVUs. Continue to titrate as tolerated. #Pain: Epidural #FWB: Cat 2 #GBS positive > PCN  Kristeen Miss, MD 9:57 AM

## 2021-06-17 NOTE — Progress Notes (Signed)
Patient ID: Gina Holmes, female   DOB: 2001-01-06, 20 y.o.   MRN: [id number] Patient has been at IOL for > 2 days. She is tired and would like to be done. Baby spontaneously went into SVT for some time and then spontaneously converted out of that rhythm. Attempt at FSE. SVE is without change. U/s shows the baby is direct OP.  Patient desires PCS. Risks include but are not limited to bleeding, infection, injury to surrounding structures, including bowel, bladder and ureters, blood clots, and death.  Likelihood of success is high.

## 2021-06-17 NOTE — Progress Notes (Signed)
Patient ID: Gina Holmes, female   DOB: 11/11/2000, 20 y.o.   MRN: [id number] Labor Progress Note [name] is a 20 y.o. G2P0010 at [redacted]w[redacted]d presented for IOL for mild polyhydramnios (AFI=29)  S:  Pt requested cervical exam because intense rectal pressure returned only on the left side. Frustrated by pain with epidural but encouraged by progress.  O:  BP (!) 130/58   Pulse 60   Temp 98.4 F (36.9 C) (Oral)   Resp 16   Ht 5\' 1"  (1.549 m)   Wt 222 lb 1.6 oz (100.7 kg)   LMP 09/15/2020   SpO2 96%   BMI 41.97 kg/m  EFM: baseline 125 bpm/ moderate variability/ 15x15 accels/ occasional early decels  Toco/IUPC: q73min and adequate (IUPC began working, showing that most recent contractions have been much closer and stronger than previous) SVE: Dilation: 8 Effacement (%): 90 Cervical Position: Middle Station: 0 Presentation: Vertex Exam by:: J Sheniqua Carolan CNM  Pitocin: 60mu/min  A/P: 20 y.o. G2P0010 [redacted]w[redacted]d  1. Labor: Active 2. FWB: Cat 1 3. Pain: Pt in throne position with left tilt so the epidural medicine works better on that side. Encouraged to call out if she begins to have more vaginal pressure than rectal.  Anticipate SVD.  Gaylan Gerold, CNM, MSN, [place] Certified Nurse Midwife, [facility]

## 2021-06-17 NOTE — Progress Notes (Signed)
Patient Vitals for the past 4 hrs:  BP Temp Temp src Pulse Resp  06/17/21 0702 (!) 109/54 -- -- 77 16  06/17/21 0640 -- 97.7 F (36.5 C) Oral -- --  06/17/21 0631 (!) 124/58 -- -- (!) 57 16  06/17/21 0601 122/71 -- -- (!) 57 18  06/17/21 0531 121/62 -- -- 60 16  06/17/21 0431 (!) 101/49 97.8 F (36.6 C) Oral (!) 55 --  06/17/21 0401 128/67 -- -- (!) 59 18   Comfortable w/epidural.  Cx 6/80/0 per RN. Pitocin at 24 mu/min.  IUPC shows ctx spacing out and inadequate. FHR Cat 1 now, has had some runs of late/variable decels, responded well to position change/IVF bolus.  Will continue present mgt, shift changing now.

## 2021-06-17 NOTE — Progress Notes (Signed)
Angelicia Lessner is a 20 y.o. G2P0010 at [redacted]w[redacted]d admitted for induction of labor due to polyhydramnios.  Subjective: Pt comfortable with epidural. Family in room for support.  Objective: BP (!) 103/53   Pulse 70   Temp 98.5 F (36.9 C) (Oral)   Resp 16   Ht 5\' 1"  (1.549 m)   Wt 100.7 kg   LMP 09/15/2020   SpO2 100%   BMI 41.97 kg/m  I/O last 3 completed shifts: In: 383.7 [I.V.:383.7] Out: -  Total I/O In: -  Out: 950 [Urine:950]  FHT:  FHR: 130 bpm, variability: moderate,  accelerations:  Present,  decelerations:  Absent UC:   regular, every 4-5 minutes SVE:   Deferred  Labs: Lab Results  Component Value Date   WBC 8.1 06/15/2021   HGB 11.2 (L) 06/15/2021   HCT 34.0 (L) 06/15/2021   MCV 80.6 06/15/2021   PLT 271 06/15/2021    Assessment / Plan: Induction of labor due to polyhydramnios,  progressing well on pitocin  Labor: Pit @ 12. MVU not adequate, deferred SVE. Will continue to titrate pit to adequate contraction pattern.  Fetal Wellbeing:  Category I Pain Control:  Epidural I/D:   GBS positive on PCN Anticipated MOD:  NSVD  Layla Barter PGY-2 06/17/2021, 12:52 AM

## 2021-06-17 NOTE — Progress Notes (Addendum)
Patient ID: Gina Holmes, female   DOB: June 04, 2001, 20 y.o.   MRN: [id number] Labor Progress Note [name] is a 20 y.o. G2P0010 at [redacted]w[redacted]d presented for IOL for mild polyhydramnios (AFI=29)  S:  Pt napped during Pitocin break and got a lot of relief from the rectal pain. Encouraged by recheck after break and ready to start Pitocin again.  O:  BP (!) 130/58   Pulse 60   Temp 98.4 F (36.9 C) (Oral)   Resp 16   Ht 5\' 1"  (1.549 m)   Wt 222 lb 1.6 oz (100.7 kg)   LMP 09/15/2020   SpO2 96%   BMI 41.97 kg/m  EFM: baseline 125 bpm/ moderate variability/ 15x15 accels/ no decels Toco/IUPC: relaxed SVE: 6.5/80/-1 and baby much more centered/lower Pitocin: will restart Pitocin at 93mu/min and titrate as tolerated to adequacy.  A/P: 20 y.o. G2P0010 [redacted]w[redacted]d  1. Labor: Active 2. FWB: Cat 1 3. Pain: well controlled with epidural  Cautiously anticipate SVD.  Gaylan Gerold, CNM, MSN, [place] Certified Nurse Midwife, [facility]

## 2021-06-17 NOTE — Anesthesia Postprocedure Evaluation (Signed)
Anesthesia Post Note  Patient: Gina Holmes  Procedure(s) Performed: CESAREAN SECTION     Patient location during evaluation: PACU Anesthesia Type: Epidural Level of consciousness: oriented and awake and alert Pain management: pain level controlled Vital Signs Assessment: post-procedure vital signs reviewed and stable Respiratory status: spontaneous breathing, respiratory function stable and nonlabored ventilation Cardiovascular status: blood pressure returned to baseline and stable Postop Assessment: no headache, no backache, no apparent nausea or vomiting, epidural receding and patient able to bend at knees Anesthetic complications: no   No notable events documented.  Last Vitals:  Vitals:   06/17/21 1946 06/17/21 2000  BP: (!) 121/58 123/62  Pulse: 83 85  Resp: 20 20  Temp: 36.8 C   SpO2: 99% 99%    Last Pain:  Vitals:   06/17/21 2000  TempSrc:   PainSc: 0-No pain   Pain Goal:    LLE Motor Response: Purposeful movement (06/17/21 2000) LLE Sensation: Tingling (06/17/21 2000) RLE Motor Response: Purposeful movement (06/17/21 2000) RLE Sensation: Tingling (06/17/21 2000)     Epidural/Spinal Function Cutaneous sensation: Tingles (06/17/21 2000), Patient able to flex knees: Yes (06/17/21 2000), Patient able to lift hips off bed: No (06/17/21 2000), Back pain beyond tenderness at insertion site: No (06/17/21 2000), Progressively worsening motor and/or sensory loss: No (06/17/21 2000), Bowel and/or bladder incontinence post epidural: No (06/17/21 2000)  Baltazar Pekala A.

## 2021-06-17 NOTE — Progress Notes (Signed)
Patient ID: Gina Holmes, female   DOB: [birth year], 20 y.o.   MRN: [id number] Labor Progress Note Gina Holmes is a 20 y.o. G2P0010 at [redacted]w[redacted]d presented for IOL for mild polyhydramnios (AFI=29)  S:  Pt somewhat comfortable in bed with epidural - feeling a lot of rectal pressure on the left side. Verbalized feeling exhausted and discouraged about her lack of progress. Does not want to turn off Pitocin until she's gotten up to 64mu/min because she's afraid we'll have to start all over. After much discussion and validation of frustration, she was amenable to new plan.   O:  BP (!) 130/58   Pulse 60   Temp 98.4 F (36.9 C) (Oral)   Resp 16   Ht 5\' 1"  (1.549 m)   Wt 222 lb 1.6 oz (100.7 kg)   LMP 09/15/2020   SpO2 96%   BMI 41.97 kg/m  EFM: baseline 125 bpm/ moderate variability/ 15x15 accels/ occasional late decels  Toco/IUPC: not tracing well but q5-39min and inadequate, IUPC has been zeroed out/flushed twice. SVE: 5/60/-3 Pitocin: 26 mu/min  A/P: 20 y.o. G2P0010 [redacted]w[redacted]d  1. Labor: Latent 2. FWB: Cat 2 at times but overall reassuring and recovers well with position changes 3. Pain: somewhat well controlled with epidural 4. Provided copious validation of pt frustration. Explained reasons for proposed interventions to pt and family at bedside. Accepted 1hr pitocin break with 1000mg  of Tums and left lateral lie with peanut ball. Promised to recheck patient and start Pitocin at 11:45am with low tolerance for slow progress/fetal intolerance of labor. MD aware of plan.  Cautiously anticipate SVD.  Gaylan Gerold, CNM, MSN, [place] Certified Nurse Midwife, [facility]

## 2021-06-18 LAB — CBC
HCT: 29.1 % — ABNORMAL LOW (ref 36.0–46.0)
Hemoglobin: 9.9 g/dL — ABNORMAL LOW (ref 12.0–15.0)
MCH: 27.7 pg (ref 26.0–34.0)
MCHC: 34 g/dL (ref 30.0–36.0)
MCV: 81.5 fL (ref 80.0–100.0)
Platelets: 232 10*3/uL (ref 150–400)
RBC: 3.57 MIL/uL — ABNORMAL LOW (ref 3.87–5.11)
RDW: 13.2 % (ref 11.5–15.5)
WBC: 21.7 10*3/uL — ABNORMAL HIGH (ref 4.0–10.5)
nRBC: 0 % (ref 0.0–0.2)

## 2021-06-18 MED ORDER — LACTATED RINGERS IV BOLUS
500.0000 mL | Freq: Once | INTRAVENOUS | Status: AC
  Administered 2021-06-18: 500 mL via INTRAVENOUS

## 2021-06-18 MED ORDER — SCOPOLAMINE 1 MG/3DAYS TD PT72
1.0000 | MEDICATED_PATCH | TRANSDERMAL | Status: DC
  Administered 2021-06-18: 1.5 mg via TRANSDERMAL
  Filled 2021-06-18: qty 1

## 2021-06-18 NOTE — Progress Notes (Signed)
Subjective: Postpartum Day 1: Cesarean Delivery Patient reports incisional pain and + flatus.    Objective: Vital signs in last 24 hours: Temp:  [97.8 F (36.6 C)-99 F (37.2 C)] 98.1 F (36.7 C) (12/10 0930) Pulse Rate:  [55-89] 55 (12/10 0930) Resp:  [15-20] 18 (12/10 0930) BP: (92-130)/(50-84) 110/56 (12/10 0930) SpO2:  [96 %-100 %] 100 % (12/10 0330)  Physical Exam:  General: alert, cooperative, appears stated age, and no distress Lochia: appropriate Uterine Fundus: firm Incision: surgical dressing in place DVT Evaluation: No evidence of DVT seen on physical exam.  Recent Labs    06/18/21 0450  HGB 9.9*  HCT 29.1*    Assessment/Plan: Status post Cesarean section. Doing well postoperatively.  Continue current care Patient eating and receiving pain medicine, will return this afternoon to discuss Nexplanon.  Darlina Rumpf, CNM 06/18/2021, [phone]

## 2021-06-19 DIAGNOSIS — Z30017 Encounter for initial prescription of implantable subdermal contraceptive: Secondary | ICD-10-CM

## 2021-06-19 DIAGNOSIS — D62 Acute posthemorrhagic anemia: Secondary | ICD-10-CM | POA: Diagnosis not present

## 2021-06-19 MED ORDER — POLYETHYLENE GLYCOL 3350 17 GM/SCOOP PO POWD: 17.0000 g | Freq: Every day | ORAL | 2 refills | Status: DC | PRN

## 2021-06-19 MED ORDER — LIDOCAINE HCL 1 % IJ SOLN
0.0000 mL | Freq: Once | INTRAMUSCULAR | Status: AC | PRN
  Administered 2021-06-19: 20 mL via INTRADERMAL
  Filled 2021-06-19: qty 20

## 2021-06-19 MED ORDER — IBUPROFEN 600 MG PO TABS: 600.0000 mg | ORAL_TABLET | Freq: Four times a day (QID) | ORAL | 1 refills | Status: DC

## 2021-06-19 MED ORDER — ACETAMINOPHEN 500 MG PO TABS: 1000.0000 mg | ORAL_TABLET | Freq: Four times a day (QID) | ORAL | 0 refills | Status: DC | PRN

## 2021-06-19 MED ORDER — OXYCODONE HCL 5 MG PO TABS: 5.0000 mg | ORAL_TABLET | Freq: Four times a day (QID) | ORAL | 0 refills | Status: DC | PRN

## 2021-06-19 MED ORDER — ETONOGESTREL 68 MG ~~LOC~~ IMPL
68.0000 mg | DRUG_IMPLANT | Freq: Once | SUBCUTANEOUS | Status: AC
  Administered 2021-06-19: 68 mg via SUBCUTANEOUS
  Filled 2021-06-19: qty 1

## 2021-06-19 NOTE — Procedures (Addendum)
Nexplanon Insertion Procedure Note  Patient was identified. Informed consent was signed, signed copy in chart. A time-out was performed.    The insertion site was identified 8-10 cm (3-4 inches) from the medial epicondyle of the humerus and 3-5 cm (1.25-2 inches) posterior to (below) the sulcus (groove) between the biceps and triceps muscles of the patient's right arm and marked. The site was prepped and draped in the usual sterile fashion. Pt was prepped with betadine swab and then injected with 2 cc of 1% lidocaine. The site was prepped with betadine. Nexplanon removed form packaging,  Device confirmed in needle, then inserted full length of needle and withdrawn per handbook instructions. Provider and patient verified presence of the implant in the woman's arm by palpation. Pt insertion site was covered with steristrips/adhesive bandage and pressure bandage. There was minimal blood loss. Patient tolerated procedure well.  Patient was given post procedure instructions and Nexplanon user card with expiration date. Condoms were recommended for STI prevention. Patient was asked to keep the pressure dressing on for 24 hours to minimize bruising and keep the adhesive bandage on for 3-5 days. The patient verbalized understanding of the plan of care and agrees.   Lot # [id number] Expiration Date 05/03/2023   I was present for the procedure and agree with above.  Tamala Julian, [place], [place] 06/19/2021 1:49 PM

## 2021-06-19 NOTE — Clinical Social Work Maternal (Signed)
CLINICAL SOCIAL WORK MATERNAL/CHILD NOTE  Patient Details  Name: Gina Holmes MRN: [id number] Date of Birth: 04/07/2001  Date:  06/19/2021  Clinical Social Worker Initiating Note:  Andray Assefa, MSW, LCSWA Date/Time: Initiated:  06/19/21/1556     Child's Name:  Gina Holmes   Biological Parents:  Mother, Father   Need for Interpreter:  None   Reason for Referral:  Behavioral Health Concerns, Current Substance Use/Substance Use During Pregnancy     Address:  [address] [place] [address]    Phone number:  [phone] (home)     Additional phone number:   Household Members/Support Persons (HM/SP):    (Father, step mother, and sister)   HM/SP Name Relationship DOB or Age  HM/SP -1        HM/SP -2        HM/SP -3        HM/SP -4        HM/SP -5        HM/SP -6        HM/SP -7        HM/SP -8          Natural Supports (not living in the home):  Spouse/significant other, Other (Comment), Parent   Professional Supports: None   Employment: Unemployed   Type of Work:     Education:   (10th grade)   Homebound arranged:    Financial Resources:  Medicaid   Other Resources:  Food Stamps  , WIC   Cultural/Religious Considerations Which May Impact Care:    Strengths:  Ability to meet basic needs  , Home prepared for child     Psychotropic Medications:         Pediatrician:       Pediatrician List:   [place]    [place]    [place]    [place]    [place]    [place]      Pediatrician Fax Number:    Risk Factors/Current Problems:  Substance Use  , Mental Health Concerns     Cognitive State:  Able to Concentrate  , Alert  , Insightful  , Linear Thinking     Mood/Affect:  Comfortable  , Interested  , Relaxed  , Calm  , Happy  , Bright     CSW Assessment: CSW met with MOB to complete consult for mental health, and substance use during pregnancy. CSW observed MOB sitting at edge of bed, and FOB sitting on couch  bonding with infant. MOB gave CSW verbal consent to complete consult while FOB was present. CSW explained role, and reason for consult. MOB was pleasant, and polite during engagement with CSW. MOB reported, history of bipolar, and depression at the age of 20 yrs old. MOB reported, her father, and his mother also have bipolar. MOB reported, history of psychotropic medication, but does not recall name. MOB reported, her last dose of medication was at the beginning of the year. MOB reported, she has been able to manage symptoms without medication. MOB reported, she does not plan to resume medication unless truly needed. CSW encourage MOB to implement healthy coping skills when symptoms arises.   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 MOB reported, the reason for use is for recreational purposes. MOB denied any additional illicit substances, and CPS involvement. CSW inform MOB of drug screen   policy, and MOB was understanding of protocol. CSW will continue to follow the CDS, and will make CPS report if warranted.   CSW provided education regarding the baby blues period vs. perinatal mood disorders, discussed treatment and gave resources for mental health follow up if concerns arise. CSW recommends self- evaluation during the postpartum time period using the New Mom Checklist from Postpartum Progress and encouraged MOB to contact a medical professional if symptoms are noted at any time.   MOB reported, since delivery she feels, "okay". MOB reported, FOB, her father, and step mother are very supportive. MOB denied SI, and HI when CSW assessed for safety.   MOB reported, there are no transportation barriers to follow up infant's care. MOB reported, she has all essentials needed to care for infant. MOB reported, infant has a car seat, and bassinet. MOB denied any additional barriers.     CSW provided education on sudden  infant death syndrome (SIDS).  CSW will continue to follow the CDS, and will make CPS report if warranted.   CSW Plan/Description:  No Further Intervention Required/No Barriers to Discharge, Sudden Infant Death Syndrome (SIDS) Education, Perinatal Mood and Anxiety Disorder (PMADs) Education, Hospital Drug Screen Policy Information, CSW Will Continue to Monitor Umbilical Cord Tissue Drug Screen Results and Make Report if Warranted   Nancy Manuele, MSW, LCSW-A Clinical Social Worker- Weekends [phone]  Stamatia Masri, LCSWA 06/19/2021, 4:00 PM 

## 2021-06-21 LAB — SURGICAL PATHOLOGY

## 2021-06-24 ENCOUNTER — Ambulatory Visit (INDEPENDENT_AMBULATORY_CARE_PROVIDER_SITE_OTHER): Payer: Medicaid Other

## 2021-06-24 ENCOUNTER — Other Ambulatory Visit: Payer: Self-pay

## 2021-06-24 VITALS — BP 126/62 | HR 58 | Wt 210.9 lb

## 2021-06-24 DIAGNOSIS — Z5189 Encounter for other specified aftercare: Secondary | ICD-10-CM

## 2021-06-28 NOTE — Progress Notes (Signed)
Pt here today for wound check.  Incision well approximated.  No odor, no drainage, no edema, and no erythema. Pt advised to allow warm soapy water to run over incision and pat dry.  Pt encouraged to contact [facility] to schedule pp visit and that she can call our office with incision concerns.  Pt verbalized understanding with on further questions.   Frances Nickels  06/28/21

## 2021-06-29 NOTE — Progress Notes (Signed)
Patient was assessed and managed by nursing staff during this encounter. I have reviewed the chart and agree with the documentation and plan. I have also made any necessary editorial changes.  Mora Bellman, MD 06/29/2021 8:24 AM

## 2021-06-30 ENCOUNTER — Telehealth (HOSPITAL_COMMUNITY): Payer: Self-pay

## 2021-06-30 NOTE — Telephone Encounter (Signed)
"  I'm doing good. Everything is going fine." Patient has no questions or concerns about her healing.  "Baby is fine. Very active. Eating and gaining weight well. Baby sleeps in a bassinet."  RN reviewed ABC's of safe sleep with patient. Patient declines any questions or concerns about baby.  EPDS score is 4.   Sharyn Lull [facility] 06/30/2021,1325

## 2021-07-11 ENCOUNTER — Encounter (HOSPITAL_COMMUNITY): Payer: Self-pay | Admitting: Family Medicine

## 2021-07-12 ENCOUNTER — Encounter (HOSPITAL_COMMUNITY): Payer: Self-pay | Admitting: Family Medicine

## 2022-04-29 IMAGING — US US OB < 14 WEEKS - US OB TV
1 series · 15 of 28 positions shown · non-contrast
Comparison: None.

CLINICAL DATA: Abdominal and back pain. Estimated gestational age
by LMP is 6 weeks 4 days. Quantitative beta HCG is pending.

EXAM:
OBSTETRIC <14 WK US AND TRANSVAGINAL OB US
TECHNIQUE: Both transabdominal and transvaginal ultrasound examinations were
performed for complete evaluation of the gestation as well as the
maternal uterus, adnexal regions, and pelvic cul-de-sac.
Transvaginal technique was performed to assess early pregnancy.

[Series 1: us ob < 14 weeks - us ob tv · 37 acquisitions, 15 frames shown]
[im 1/37]
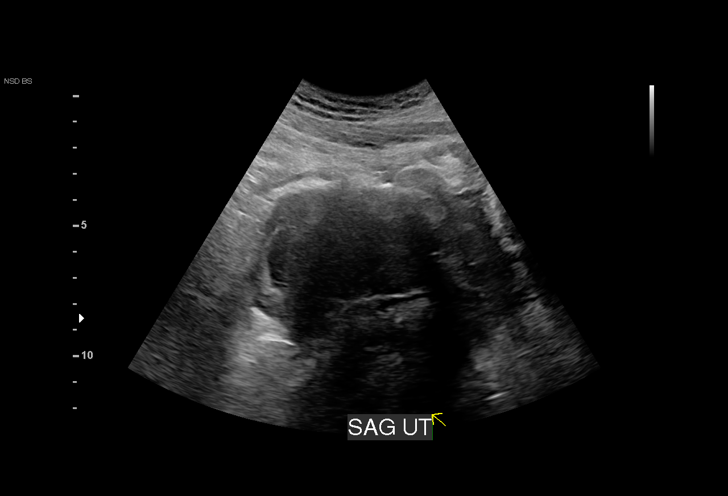
[im 3/37]
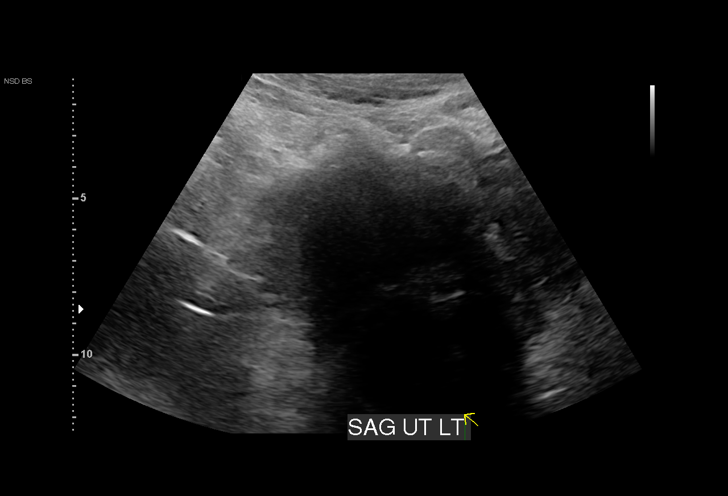
[im 6/37]
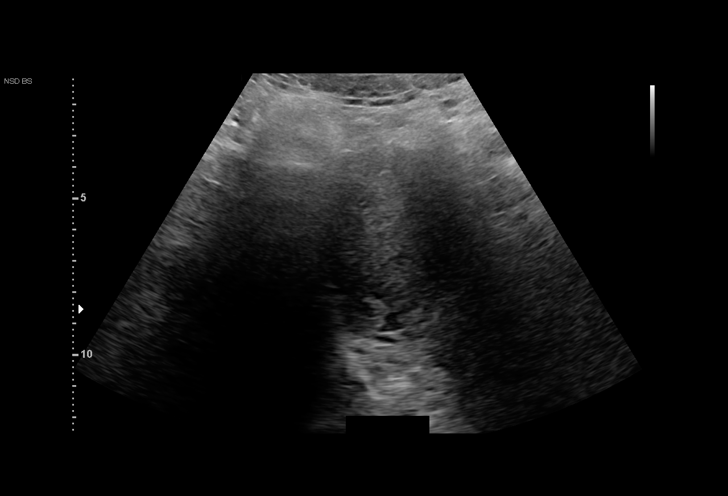
[im 9/37]
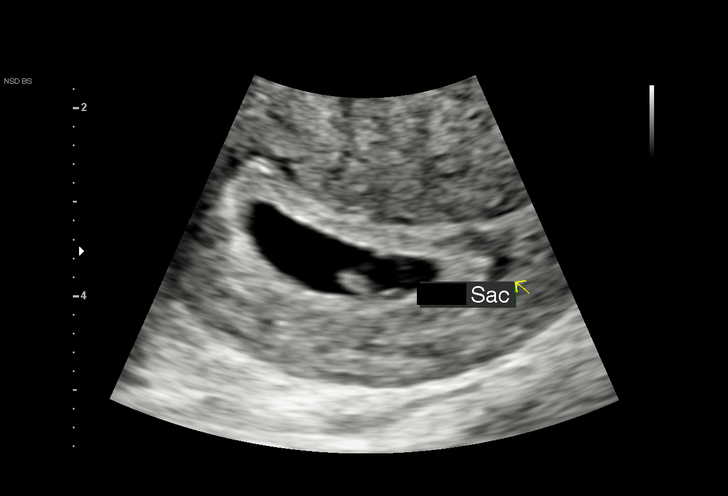
[im 11/37]
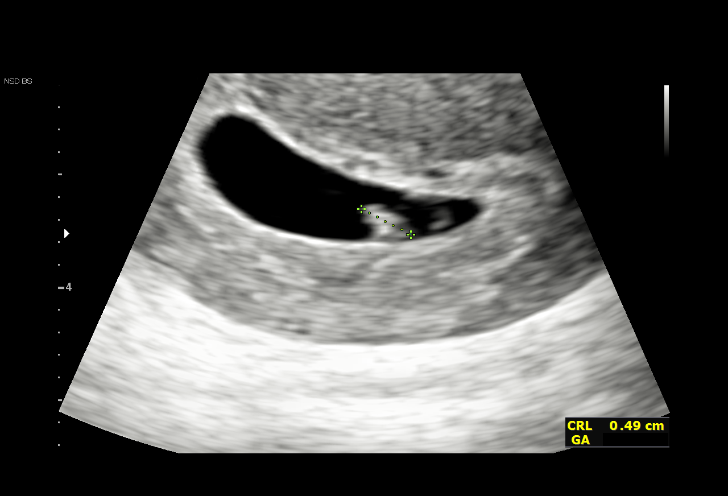
[im 14/37]
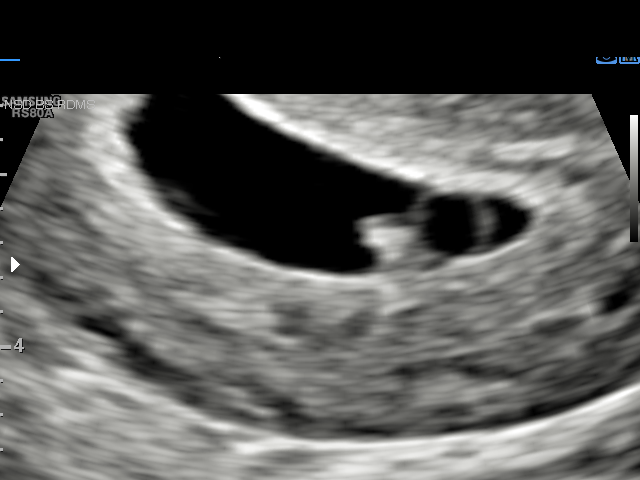
[im 17/37]
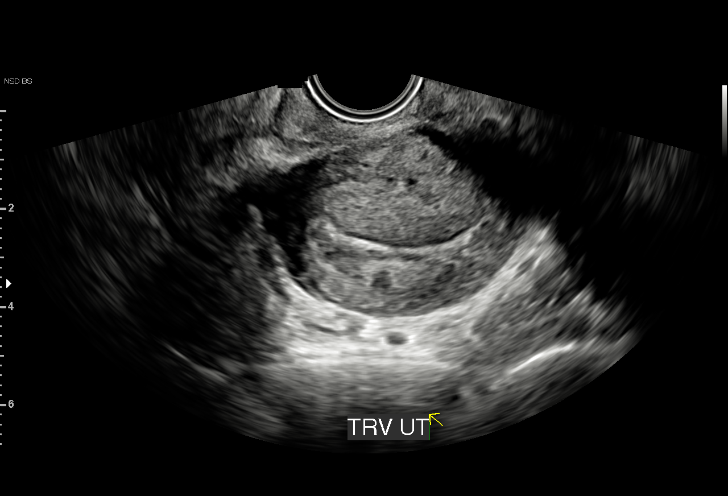
[im 19/37]
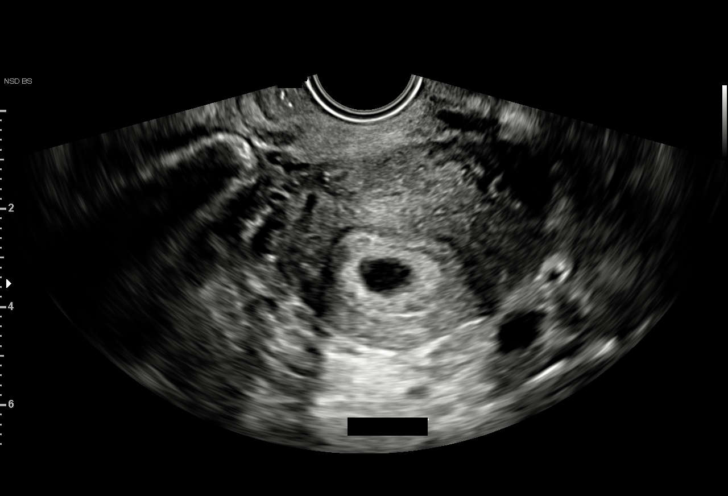
[im 21/37]
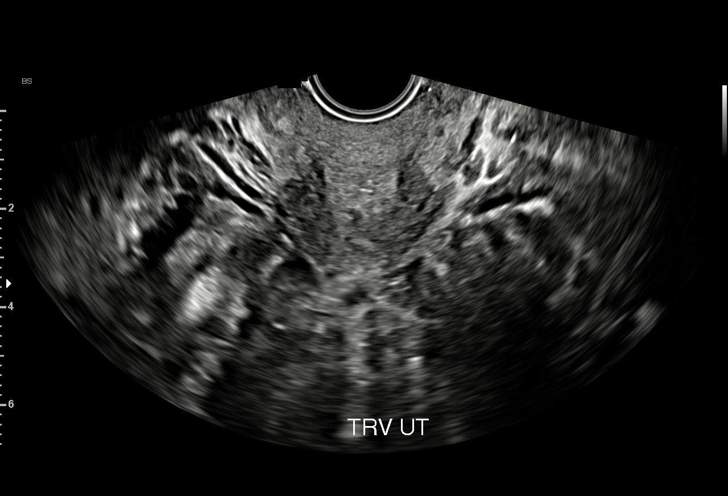
[im 23/37]
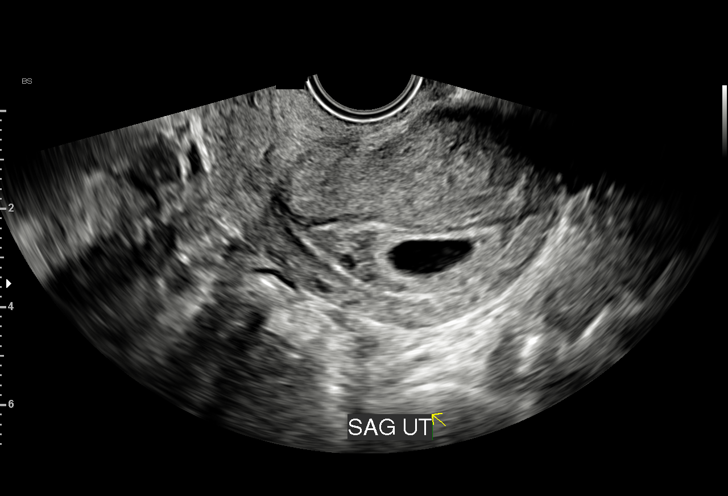
[im 26/37]
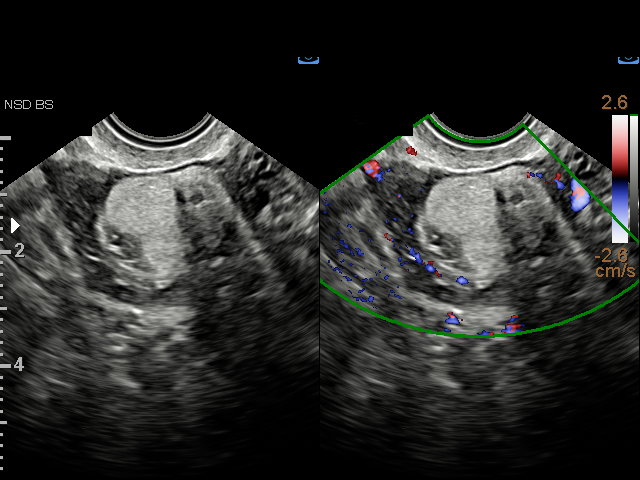
[im 29/37]
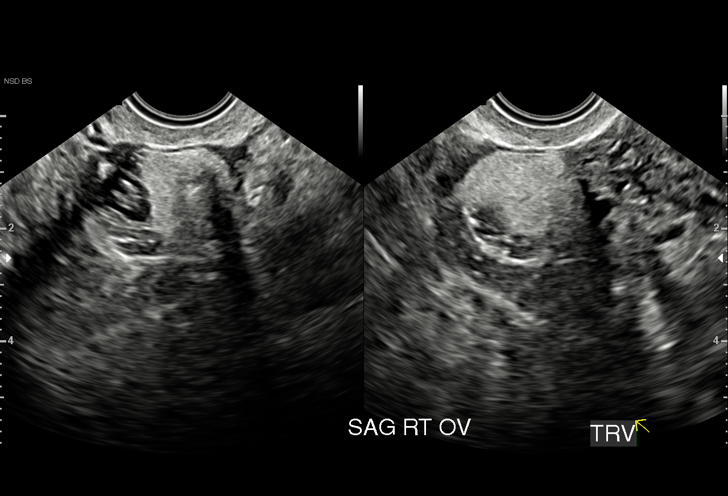
[im 31/37]
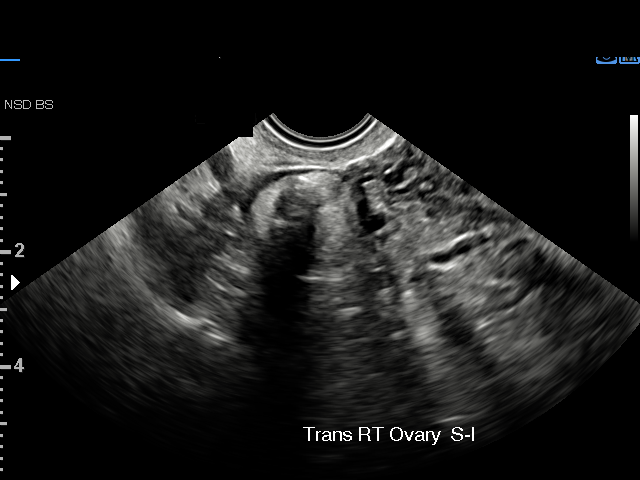
[im 34/37]
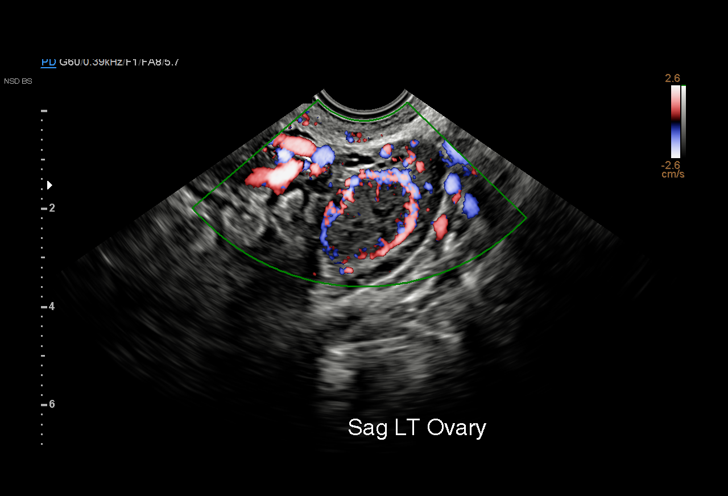
[im 37/37]
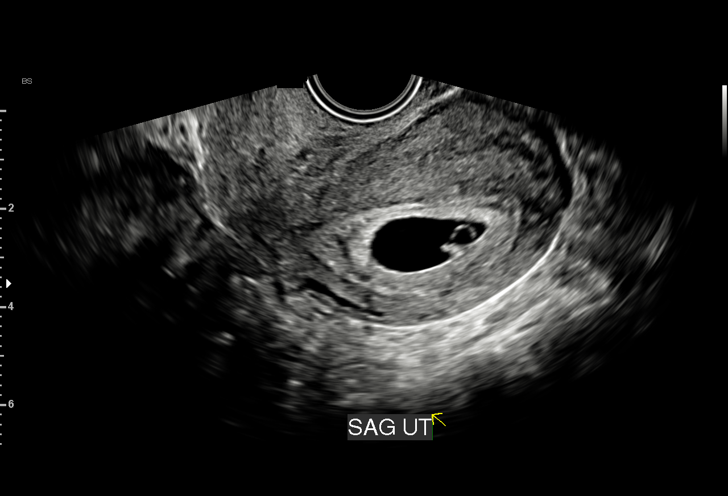

[15 of 28 positions shown; findings below may reference images not displayed]

FINDINGS: Intrauterine gestational sac: A single intrauterine gestational sac
is present.

Yolk sac:  The yolk sac is visualized.

Embryo:  The fetal pole is visualized.

Cardiac Activity: Fetal cardiac activity is observed.

Heart Rate: 116 bpm

CRL:  4.8 mm   6 w   1 d                  US EDC: 06/25/2021

Subchorionic hemorrhage: A small subchorionic hemorrhage is
identified inferiorly.

Maternal uterus/adnexae: The uterus is retroverted. No myometrial
mass lesions are identified. Both ovaries are visualized. The right
ovary contains a heterogeneous mixed echotexture structure with
hyperechoic changes predominant. This measures about 2.7 cm in
maximal diameter. Likely an ovarian dermoid cyst. The left ovary
contains a corpus luteal cyst. Small amount of free fluid in the
pelvis.
IMPRESSION: 1. Single intrauterine pregnancy. Estimated gestational age by
crown-rump length is 6 weeks 1 day. No acute complication is
suggested sonographically.
2. Probable ovarian dermoid cyst in the right ovary measuring 2.7 cm
maximal diameter.

## 2022-06-24 ENCOUNTER — Telehealth: Payer: Medicaid Other | Admitting: Nurse Practitioner

## 2022-06-24 DIAGNOSIS — H9203 Otalgia, bilateral: Secondary | ICD-10-CM

## 2022-06-24 NOTE — Progress Notes (Signed)
Based on what you shared with me it looks like you have ear pain,that should be evaluated in a face to face office visit. Needs to be looked at to see what treatment needs to be.  NOTE: There will be NO CHARGE for this eVisit   If you are having a true medical emergency please call 911.      For an urgent face to face visit, [facility] has six urgent care centers for your convenience:     [facility] [place] Get Driving Directions [phone] [place], [place] [address]    [facility]) Get Driving Directions [phone] [place], [place] [address]  [facility]) Get Driving Directions [phone] [address] [place],  [place]  [address]  [facility] Get Driving Directions [phone] [place], [place], [place] [address]   [facility] Get Driving Directions  [phone] [address] [place], [place] [address]   [facility] Get Driving Directions [phone] [address] [place],  [address]  Your MyChart E-visit questionnaire answers were reviewed by a board certified advanced clinical practitioner to complete your personal care plan based on your specific symptoms.  Thank you for using e-Visits.

## 2022-06-25 ENCOUNTER — Ambulatory Visit (HOSPITAL_COMMUNITY)
Admission: EM | Admit: 2022-06-25 | Discharge: 2022-06-25 | Disposition: A | Discharge status: Home / Self Care | Payer: Medicaid Other | Attending: Emergency Medicine | Admitting: Emergency Medicine

## 2022-06-25 ENCOUNTER — Encounter (HOSPITAL_COMMUNITY): Payer: Self-pay

## 2022-06-25 DIAGNOSIS — B349 Viral infection, unspecified: Secondary | ICD-10-CM | POA: Diagnosis not present

## 2022-06-25 MED ORDER — ACETAMINOPHEN 500 MG PO TABS: 500.0000 mg | ORAL_TABLET | ORAL | 0 refills | Status: AC | PRN

## 2022-06-25 MED ORDER — IBUPROFEN 800 MG PO TABS: 800.0000 mg | ORAL_TABLET | Freq: Four times a day (QID) | ORAL | 0 refills | Status: DC | PRN

## 2022-06-25 MED ORDER — IBUPROFEN 800 MG PO TABS
ORAL_TABLET | ORAL | Status: AC
  Filled 2022-06-25: qty 1

## 2022-06-25 MED ORDER — IBUPROFEN 800 MG PO TABS
800.0000 mg | ORAL_TABLET | Freq: Once | ORAL | Status: AC
  Administered 2022-06-25: 800 mg via ORAL

## 2022-06-25 NOTE — ED Triage Notes (Signed)
Patient with c/o cough, sore throat, congestion and headache for "the past couple of days". Patient states she also feels tired and weaker than usual.

## 2022-06-25 NOTE — Discharge Instructions (Addendum)
You can alternate the ibuprofen and tylenol for headache, body aches, fever, sore throat.  Try mucinex (guaifenesin) twice daily for congestion. You can add nasal spray such as flonase to reduce congestion and ear pressure.  Drink lots of fluids!

## 2022-06-25 NOTE — ED Provider Notes (Addendum)
[facility]    CSN: [id number] Arrival date & time: 06/25/22  1100      History   Chief Complaint Chief Complaint  Patient presents with   Cough    HPI Gina Holmes is a 21 y.o. female.  Presents with 3-day history of nasal congestion, cough, sore throat, headache, body aches, fatigue Tmax 101 yesterday, none today Decreased appetite, trying to drink fluids She tried Tylenol at home.  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 Authorizing Provider  ibuprofen (ADVIL) 800 MG tablet Take 1 tablet (800 mg total) by mouth every 6 (six) hours as needed. 06/25/22  Yes Clide Remmers, Wells Guiles, PA-C  acetaminophen (TYLENOL) 500 MG tablet Take 1 tablet (500 mg total) by mouth every 4 (four) hours as needed. 06/25/22   Keeana Pieratt, Wells Guiles, PA-C  Prenatal Vit-Fe Fumarate-FA (PRENATAL VITAMIN PLUS LOW IRON) 27-1 MG TABS Take 1 tablet by mouth daily. 03/08/21   [provider]    Family History Family History  Problem Relation Age of Onset   Obesity Mother    Obesity  Father    Obesity Sister    Cancer Maternal Grandfather     Social History Social History   Tobacco Use   Smoking status: Every Day    Packs/day: 0.25    Types: Cigarettes   Smokeless tobacco: Never   Tobacco comments:    1 cigarrette per day  Vaping Use   Vaping Use: Never used  Substance Use Topics   Alcohol use: Yes    Comment: socially   Drug use: Yes    Types: Marijuana    Comment: daily     Allergies   Black walnut flavor, Cherry, Apple juice, Peach flavor, and Pear   Review of Systems Review of Systems Per HPI  Physical Exam Triage Vital Signs ED Triage Vitals [06/25/22 1215]  Enc Vitals Group     BP 123/72     Pulse Rate 89     Resp 18     Temp 98.4 F (36.9 C)     Temp Source Oral     SpO2 98 %     Weight      Height      Head Circumference      Peak Flow  Pain Score 7     Pain Loc      Pain Edu?      Excl. in [place]?    No data found.  Updated Vital Signs BP 123/72 (BP Location: Right Arm)   Pulse 89   Temp 98.4 F (36.9 C) (Oral)   Resp 18   LMP 06/20/2022 (Approximate)   SpO2 98%    Physical Exam Vitals and nursing note reviewed.  Constitutional:      Appearance: She is ill-appearing.  HENT:     Right Ear: Tympanic membrane and ear canal normal.     Left Ear: Tympanic membrane and ear canal normal.     Nose: Congestion present. No rhinorrhea.     Mouth/Throat:     Mouth: Mucous membranes are moist.     Pharynx: Oropharynx is clear. No posterior oropharyngeal erythema.  Eyes:     Conjunctiva/sclera: Conjunctivae normal.  Cardiovascular:     Rate and Rhythm: Normal rate and regular rhythm.     Pulses: Normal pulses.     Heart sounds: Normal heart sounds.  Pulmonary:     Effort: Pulmonary effort is normal.     Breath sounds: Normal breath sounds.  Lymphadenopathy:     Cervical: No cervical adenopathy.  Neurological:     Mental Status: She is alert and oriented to person, place, and time.    UC Treatments / Results   Labs (all labs ordered are listed, but only abnormal results are displayed) Labs Reviewed - No data to display  EKG  Radiology No results found.  Procedures Procedures  Medications Ordered in UC Medications  ibuprofen (ADVIL) tablet 800 mg (800 mg Oral Given 06/25/22 1302)    Initial Impression / Assessment and Plan / UC Course  I have reviewed the triage vital signs and the nursing notes.  Pertinent labs & imaging results that were available during my care of the patient were reviewed by me and considered in my medical decision making (see chart for details).  Defer viral testing, shared decision making Ibuprofen dose given for aches Discussed viral etiology, recommend symptomatic care. Provided work note. Return precautions discussed. Patient agrees to plan  Final Clinical Impressions(s) / UC Diagnoses   Final diagnoses:  Viral illness     Discharge Instructions      You can alternate the ibuprofen and tylenol for headache, body aches, fever, sore throat.  Try mucinex (guaifenesin) twice daily for congestion. You can add nasal spray such as flonase to reduce congestion and ear pressure.  Drink lots of fluids!    ED Prescriptions     Medication Sig Dispense Auth. Provider   acetaminophen (TYLENOL) 500 MG tablet Take 1 tablet (500 mg total) by mouth every 4 (four) hours as needed. 30 tablet Nyliah Nierenberg, PA-C   ibuprofen (ADVIL) 800 MG tablet Take 1 tablet (800 mg total) by mouth every 6 (six) hours as needed. 21 tablet Ricki Vanhandel, Wells Guiles, PA-C      PDMP not reviewed this encounter.   Nichole Neyer, Vernice Jefferson 06/25/22 1741    Alithia Zavaleta, Wells Guiles, PA-C 06/25/22 1741

## 2022-06-28 IMAGING — US US MFM OB COMP +14 WKS
1 series · 13 of 28 positions shown · non-contrast
Comparison: none

[Series 1: us mfm ob comp +14 wks · 13 of 140 slices shown]
[im 6/140]
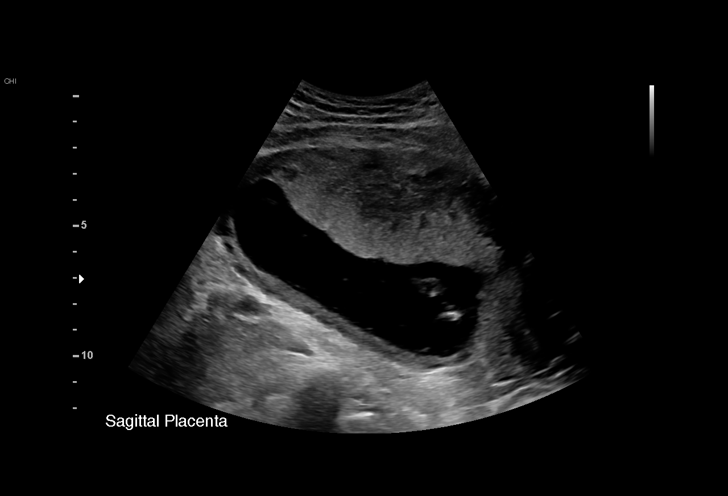
[im 16/140]
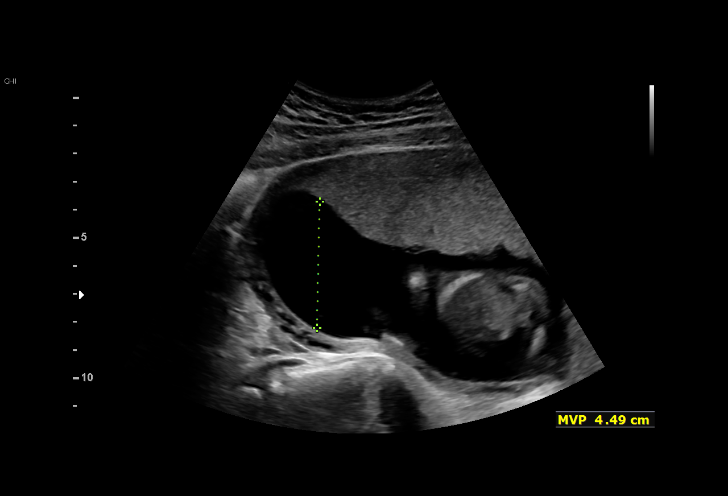
[im 26/140]
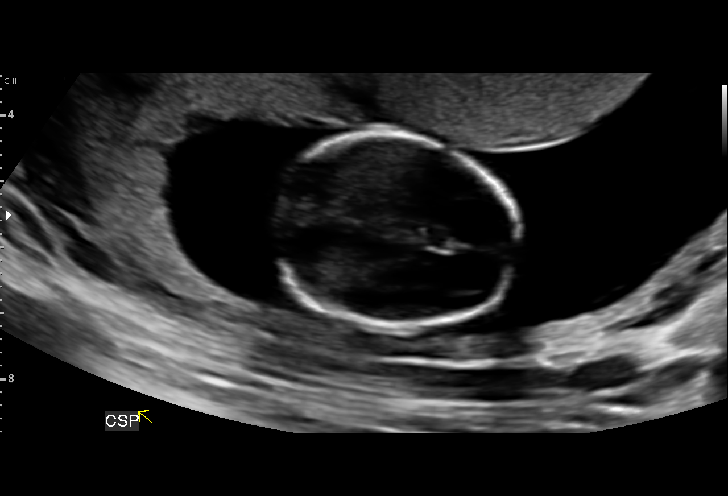
[im 37/140]
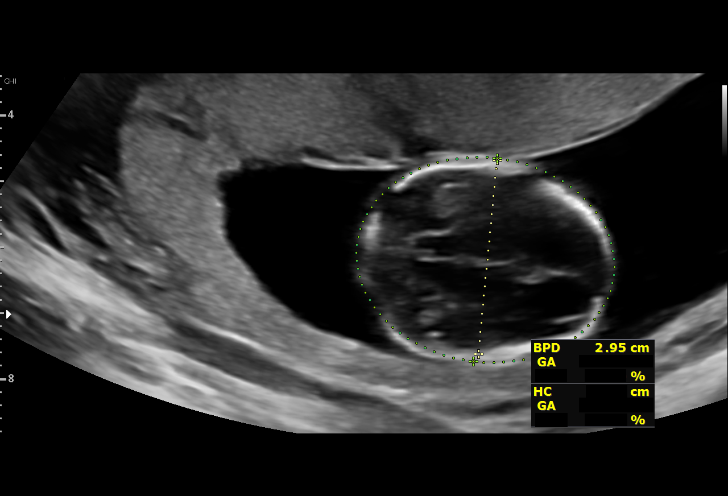
[im 47/140]
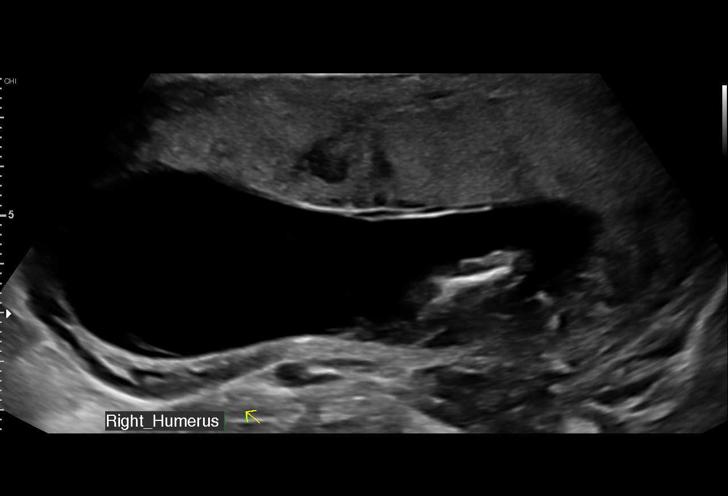
[im 57/140]
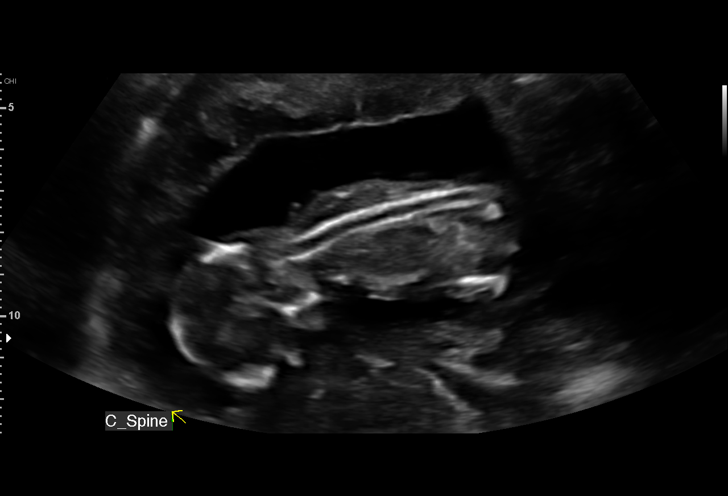
[im 73/140]
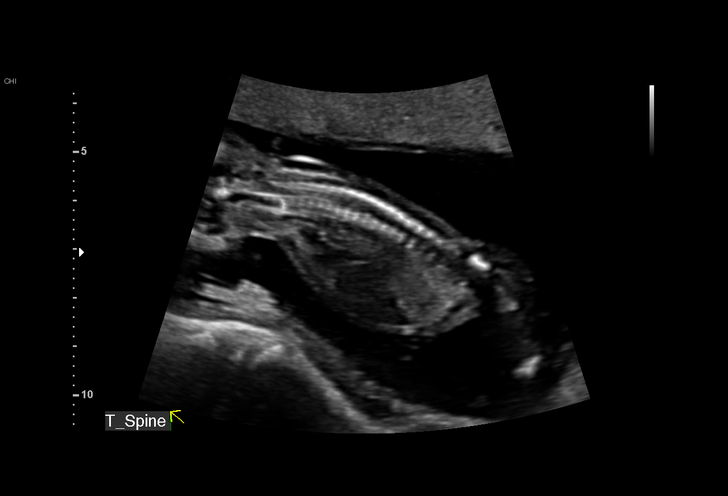
[im 83/140]
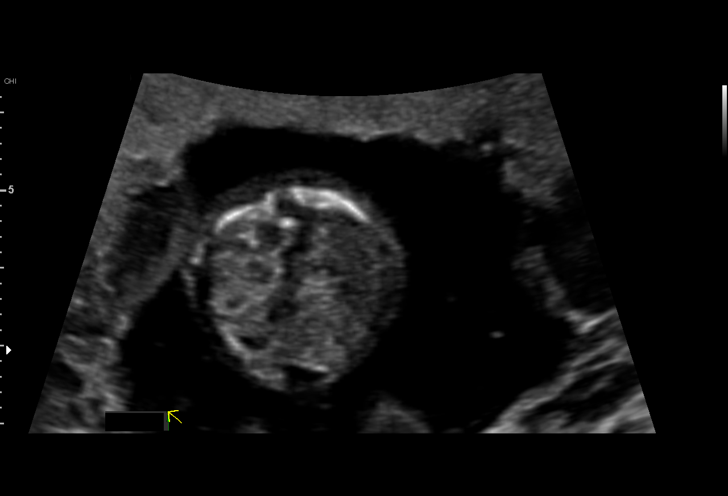
[im 93/140]
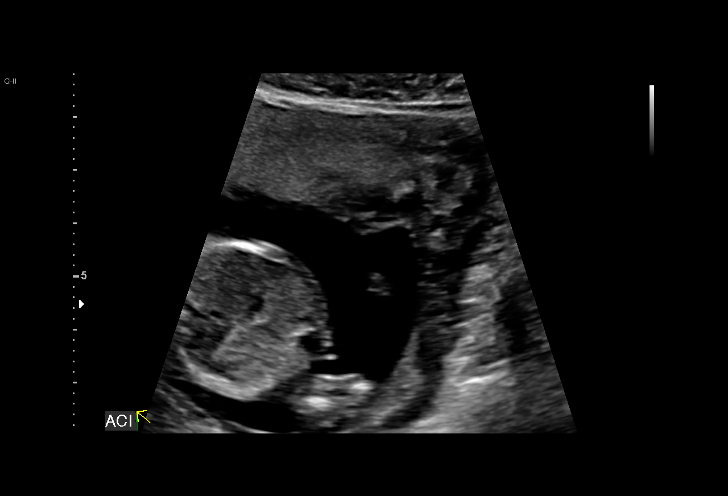
[im 103/140]
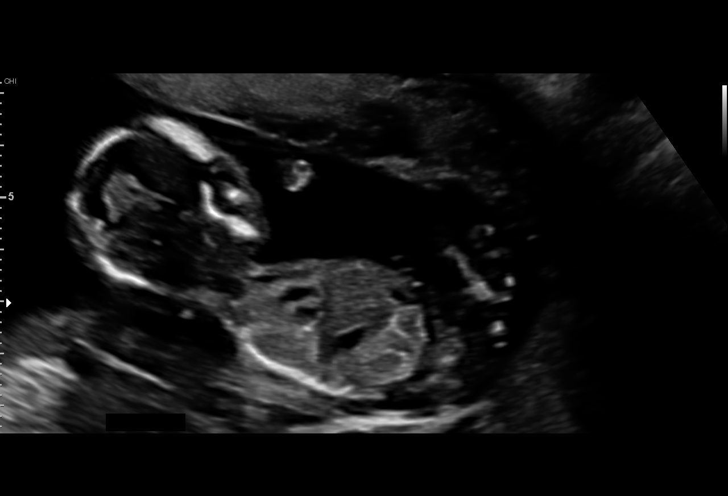
[im 114/140]
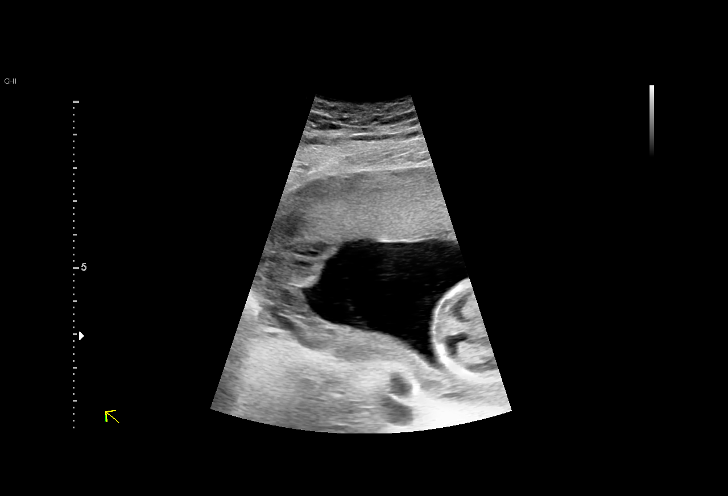
[im 124/140]
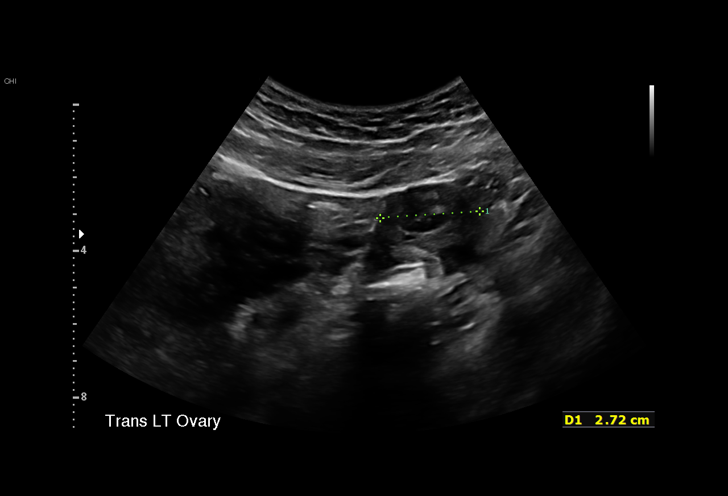
[im 134/140]
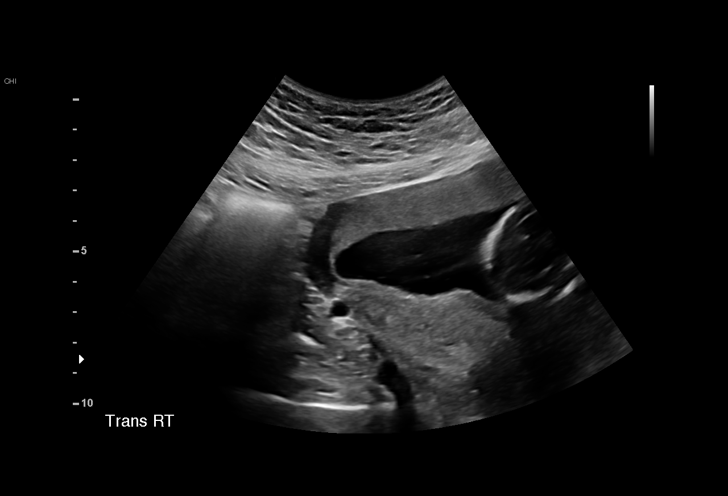

[13 of 28 positions shown; findings below may reference images not displayed]

NJEIM

 1  US MFM OB COMP + 14 WK                76805.01    CECILIO SPRUILL

Indications

 Encounter for antenatal screening for
 malformations
 15 weeks gestation of pregnancy
 Substance abuse affecting pregnancy,
 antepartum (marijuana)
 Seizure disorder (family history of)
 Family history of autism
 Hyperemesis gravidarum
 Smoking complicating pregnancy, second
 trimester
 Obesity complicating pregnancy, second
 trimester BMI=35
 Ovarian dermoid cyst complicating              O34.80,
 pregnancy, antepartum (right
Fetal Evaluation

 Num Of Fetuses:         1
 Fetal Heart Rate(bpm):  157
 Cardiac Activity:       Observed
 Presentation:           Breech
 Placenta:               Anterior
 P. Cord Insertion:      Visualized, central

 Amniotic Fluid
 AFI FV:      Within normal limits

                             Largest Pocket(cm)

Biometry
 BPD:      29.1  mm     G. Age:  15w 2d         49  %    CI:        70.93   %    70 - 86
                                                         FL/HC:      14.2   %    15.3 -
 HC:      110.1  mm     G. Age:  15w 2d         40  %    HC/AC:      1.18        1.05 -
 AC:         93  mm     G. Age:  15w 3d         65  %    FL/BPD:     53.6   %
 FL:       15.6  mm     G. Age:  14w 4d         24  %    FL/AC:      16.8   %    20 - 24
 HUM:        16  mm     G. Age:  14w 4d         34  %
 CER:      14.5  mm     G. Age:  15w 4d         35  %
 NFT:       2.5  mm
 CM:        2.5  mm

 Est. FW:     114  gm      0 lb 4 oz     33  %
OB History

 Gravidity:    2
 TOP:          1        Living:  0
Gestational Age

 LMP:           15w 1d        Date:  09/15/20                 EDD:   06/22/21
 U/S Today:     15w 1d                                        EDD:   06/22/21
 Best:          15w 1d     Det. By:  LMP  (09/15/20)          EDD:   06/22/21
Anatomy

 Cranium:               Appears normal         LVOT:                   Not well visualized
 Cavum:                 Appears normal         Aortic Arch:            Not well visualized
 Ventricles:            Appears normal         Ductal Arch:            Not well visualized
 Choroid Plexus:        Appears normal         Diaphragm:              Appears normal
 Cerebellum:            Appears normal         Stomach:                Appears normal, left
                                                                       sided
 Posterior Fossa:       Appears normal         Abdomen:                Appears normal
 Nuchal Fold:           Appears normal         Abdominal Wall:         Appears nml (cord
                                                                       insert, abd wall)
 Face:                  Orbits appear          Cord Vessels:           Appears normal (3
                        normal                                         vessel cord)
 Lips:                  Not well visualized    Kidneys:                Appear normal
 Palate:                Not well visualized    Bladder:                Appears normal
 Thoracic:              Appears normal         Spine:                  Appears normal
 Heart:                 Not well visualized    Upper Extremities:      Appears normal
 RVOT:                  Not well visualized    Lower Extremities:      Appears normal

 Other:  Fetus appears to be a male. Lenses visualized. Heels/feet and open
         hands/5th digits visualized. Technically difficult due to early
         gestational age.
Cervix Uterus Adnexa

 Cervix
 Closed

 Uterus
 No abnormality visualized.

 Right Ovary
 Not visualized. Probable small dermoid by previous Ultrasound
 Left Ovary
 Small corpus luteum noted.

 Cul De Sac
 No free fluid seen.

 Adnexa
 No abnormality visualized.
Impression

 G2 P0.  Patient is here for ultrasound evaluation.  She was
 recently admitted with diagnosis of hyperemesis gravidarum
 and was discharged.
 Her pregnancy is well dated by LMP date that was consistent
 with 6-week ultrasound.
 She has not had screening for fetal aneuploidies.  She will be
 meeting with our genetic counselor after ultrasound.
 Fetal biometry is consistent with the previously established
 dates.  Amniotic fluid is normal and good fetal activity seen.
 Anatomical survey was very limited because of early
 gestational age and no obvious fetal structural defects were
 seen.
Recommendations

 -An appointment was made for her to return in 4 weeks for
 detailed fetal anatomical survey.
                 Wiese, Cia

## 2022-11-14 ENCOUNTER — Telehealth: Payer: Medicaid Other | Admitting: Physician Assistant

## 2022-11-14 DIAGNOSIS — K529 Noninfective gastroenteritis and colitis, unspecified: Secondary | ICD-10-CM | POA: Diagnosis not present

## 2022-11-14 NOTE — Patient Instructions (Signed)
Alfonzo Feller, thank you for joining Piedad Climes, PA-C for today's virtual visit.  While this provider is not your primary care provider (PCP), if your PCP is located in our provider database this encounter information will be shared with them immediately following your visit.   A [facility] MyChart account gives you access to today's visit and all your visits, tests, and labs performed at [facility] " click here if you don't have a [facility] MyChart account or go to [email or web address]  Consent: (Patient) Gina Holmes provided verbal consent for this virtual visit at the beginning of the encounter.  Current Medications:  Current Outpatient Medications:    acetaminophen (TYLENOL) 500 MG tablet, Take 1 tablet (500 mg total) by mouth every 4 (four) hours as needed., Disp: 30 tablet, Rfl: 0   ibuprofen (ADVIL) 800 MG tablet, Take 1 tablet (800 mg total) by mouth every 6 (six) hours as needed., Disp: 21 tablet, Rfl: 0   Prenatal Vit-Fe Fumarate-FA (PRENATAL VITAMIN PLUS LOW IRON) 27-1 MG TABS, Take 1 tablet by mouth daily., Disp: , Rfl:    Medications ordered in this encounter:  No orders of the defined types were placed in this encounter.    *If you need refills on other medications prior to your next appointment, please contact your pharmacy*  Follow-Up: Call back or seek an in-person evaluation if the symptoms worsen or if the condition fails to improve as anticipated.  [facility] [phone]  Other Instructions  We are sorry that you are not feeling well.  Here is how we plan to help!  Based on what you have shared with me it looks like you have Acute Infectious Diarrhea.  Most cases of acute diarrhea are due to infections with virus and bacteria and are self-limited conditions lasting less than 14 days.  For your symptoms you may take Imodium 2 mg tablets that are over the counter at your local pharmacy. Take two tablet now and then  one after each loose stool up to 6 a day.  Antibiotics are not needed for most people with diarrhea.  I have prescribed Zofran to help with nausea and vomiting.  HOME CARE We recommend changing your diet to help with your symptoms for the next few days. Drink plenty of fluids that contain water salt and sugar. Sports drinks such as Gatorade may help.  You may try broths, soups, bananas, applesauce, soft breads, mashed potatoes or crackers.  You are considered infectious for as long as the diarrhea continues. Hand washing or use of alcohol based hand sanitizers is recommend. It is best to stay out of work or school until your symptoms stop.   GET HELP RIGHT AWAY If you have dark yellow colored urine or do not pass urine frequently you should drink more fluids.   If your symptoms worsen  If you feel like you are going to pass out (faint) You have a new problem  MAKE SURE YOU  Understand these instructions. Will watch your condition. Will get help right away if you are not doing well or get worse.    If you have been instructed to have an in-person evaluation today at a local Urgent Care facility, please use the link below. It will take you to a list of all of our available [facility], including address, phone number and hours of operation. Please do not delay care.  [facility]  If you or a family member do not  have a primary care provider, use the link below to schedule a visit and establish care. When you choose a [facility] primary care physician or advanced practice provider, you gain a long-term partner in health. Find a Primary Care Provider  Learn more about [facility]'s in-office and virtual care options: [facility]

## 2022-11-14 NOTE — Progress Notes (Signed)
Virtual Visit Consent   Gina Holmes, you are scheduled for a virtual visit with a [facility] provider today. Just as with appointments in the office, your consent must be obtained to participate. Your consent will be active for this visit and any virtual visit you may have with one of our providers in the next 365 days. If you have a MyChart account, a copy of this consent can be sent to you electronically.  As this is a virtual visit, video technology does not allow for your provider to perform a traditional examination. This may limit your provider's ability to fully assess your condition. If your provider identifies any concerns that need to be evaluated in person or the need to arrange testing (such as labs, EKG, etc.), we will make arrangements to do so. Although advances in technology are sophisticated, we cannot ensure that it will always work on either your end or our end. If the connection with a video visit is poor, the visit may have to be switched to a telephone visit. With either a video or telephone visit, we are not always able to ensure that we have a secure connection.  By engaging in this virtual visit, you consent to the provision of healthcare and authorize for your insurance to be billed (if applicable) for the services provided during this visit. Depending on your insurance coverage, you may receive a charge related to this service.  I need to obtain your verbal consent now. Are you willing to proceed with your visit today? Gina Holmes has provided verbal consent on 11/14/2022 for a virtual visit (video or telephone). Piedad Climes, [place]  Date: 11/14/2022 9:00 AM  Virtual Visit via Video Note   I, Piedad Climes, connected with  Gina Holmes  ([id number], 2000/09/29) on 11/14/22 at  9:00 AM EDT by a video-enabled telemedicine application and verified that I am speaking with the correct person using two identifiers.  Location: Patient: Virtual Visit Location Patient:  Home Provider: Virtual Visit Location Provider: Home Office   I discussed the limitations of evaluation and management by telemedicine and the availability of in person appointments. The patient expressed understanding and agreed to proceed.    History of Present Illness: Gina Holmes is a 22 y.o. who identifies as a female who was assigned female at birth, and is being seen today for onset of vomiting and diarrhea around 3 am this morning. Notes last night she had some spaghetti which was the first time she had beef in quite some time. Notes others in the home had the same meal but without issue. Has had about 6 episodes of nonbloody emesis since onset and loose stool without melena, hematochezia or tenesmus. Is sipping on water. Denies fever, chills, aches. Denies recent travel.  Son with some GI symptoms while on school field trip but was feeling better by the time he got home.    HPI: HPI  Problems:  Patient Active Problem List   Diagnosis Date Noted   Cesarean delivery, delivered, current hospitalization 06/19/2021   Arrest of dilation, delivered, current hospitalization 06/19/2021   Anemia due to blood loss, acute 06/19/2021   Polyhydramnios 06/15/2021   Asthma 05/24/2021   Marijuana use 11/24/2020   Hyperemesis affecting pregnancy, antepartum 11/24/2020    Allergies:  Allergies  Allergen Reactions   Black Freight forwarder and Shortness Of Breath   Cherry Hives and Shortness Of Breath   Apple Juice Hives    "RED APPLES" itchy throat also  Peach Flavor Hives   Pear Hives   Medications:  Current Outpatient Medications:    acetaminophen (TYLENOL) 500 MG tablet, Take 1 tablet (500 mg total) by mouth every 4 (four) hours as needed., Disp: 30 tablet, Rfl: 0   ibuprofen (ADVIL) 800 MG tablet, Take 1 tablet (800 mg total) by mouth every 6 (six) hours as needed., Disp: 21 tablet, Rfl: 0  Observations/Objective: Patient is well-developed, well-nourished in no acute distress.   Resting comfortably at home.  Head is normocephalic, atraumatic.  No labored breathing. Speech is clear and coherent with logical content.  Patient is alert and oriented at baseline.  Assessment and Plan: 1. Gastroenteritis  Seems viral in nature. Supportive measures and OTC medications reviewed. Start [employer] Zofran per orders. Work note provided. Strict ER precautions reviewed with patient.   Follow Up Instructions: I discussed the assessment and treatment plan with the patient. The patient was provided an opportunity to ask questions and all were answered. The patient agreed with the plan and demonstrated an understanding of the instructions.  A copy of instructions were sent to the patient via MyChart unless otherwise noted below.   The patient was advised to call back or seek an in-person evaluation if the symptoms worsen or if the condition fails to improve as anticipated.  Time:  I spent 10 minutes with the patient via telehealth technology discussing the above problems/concerns.    Piedad Climes, PA-C

## 2022-11-20 IMAGING — DX DG CHEST 1V PORT
1 series · 1 of 1 positions shown · non-contrast
Comparison: None.

CLINICAL DATA: Short of breath.

EXAM:
PORTABLE CHEST 1 VIEW

[chest]
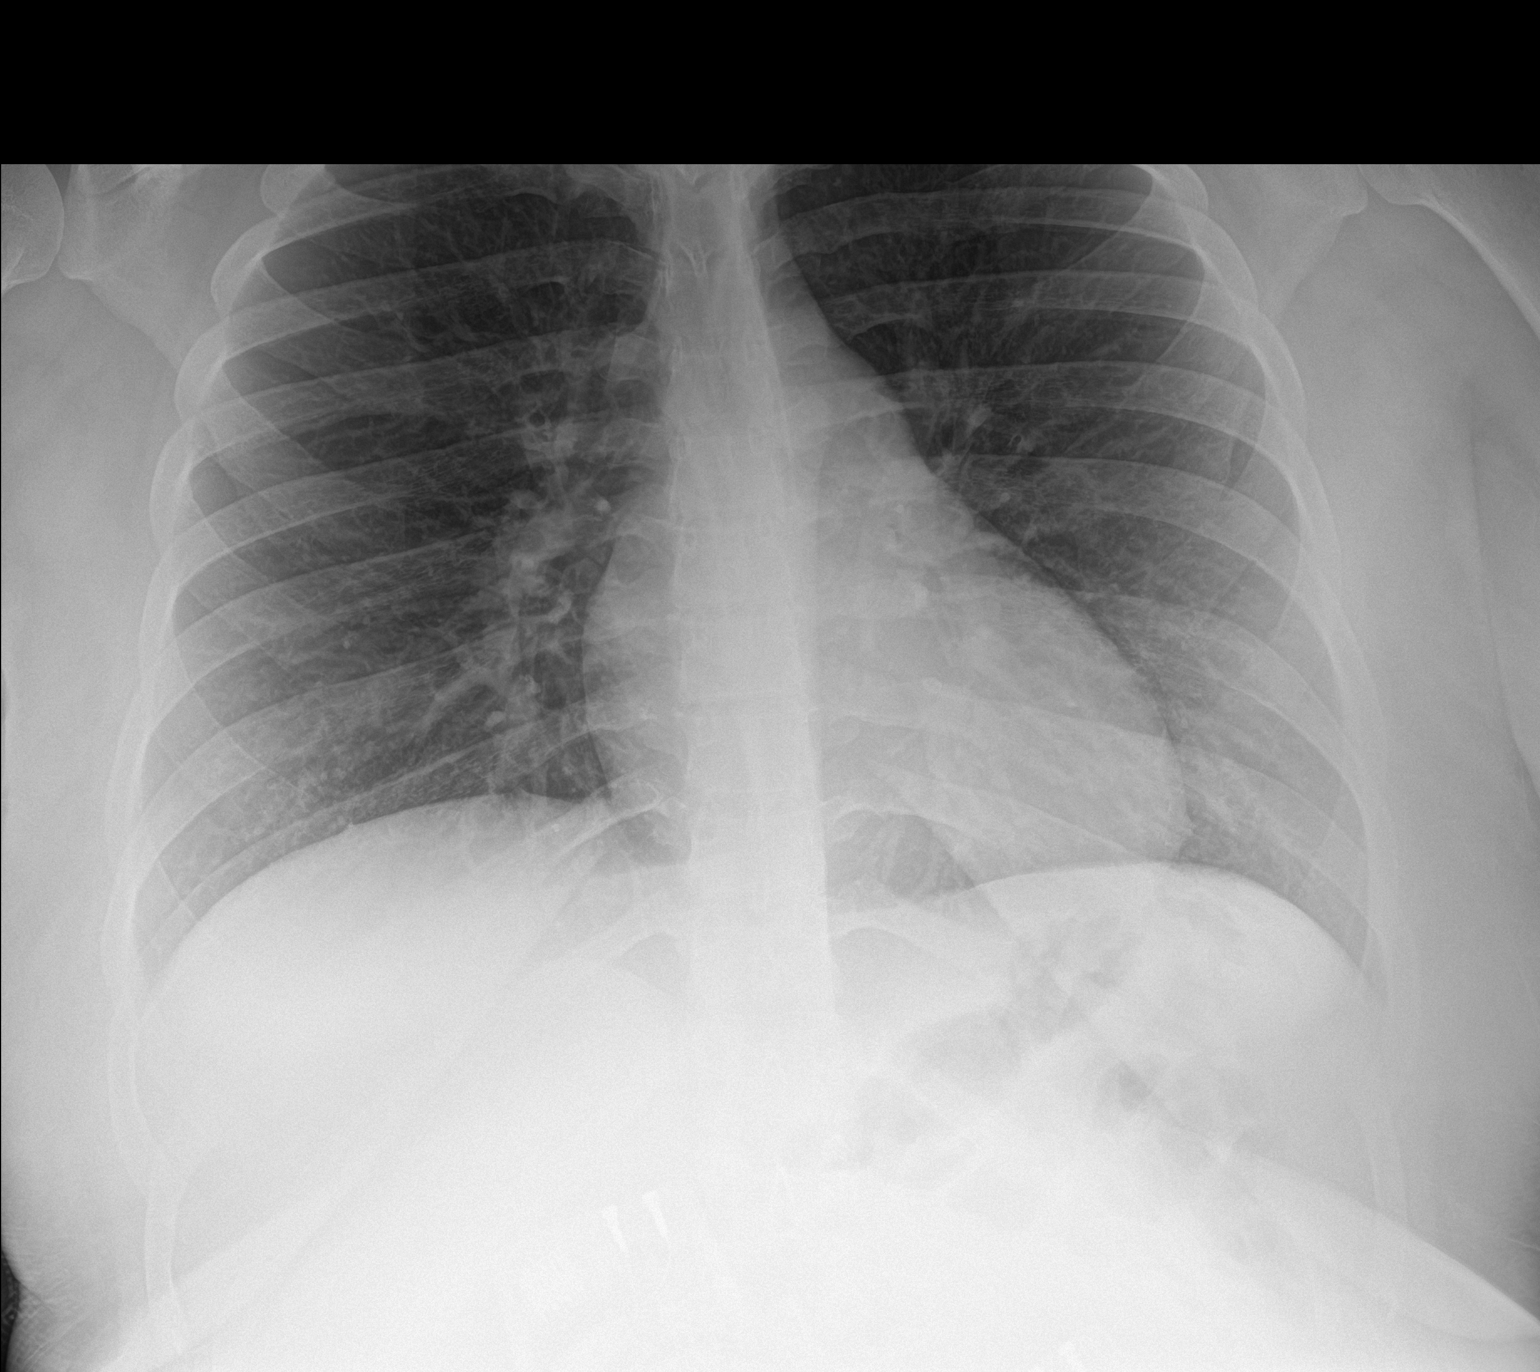

[1 of 1 positions shown; findings below may reference images not displayed]

FINDINGS: The heart size and mediastinal contours are within normal limits.
Both lungs are clear. The visualized skeletal structures are
unremarkable.
IMPRESSION: No active disease.

## 2023-04-26 ENCOUNTER — Encounter (HOSPITAL_COMMUNITY): Payer: Self-pay | Admitting: Emergency Medicine

## 2023-04-26 ENCOUNTER — Emergency Department (HOSPITAL_COMMUNITY)
Admission: EM | Admit: 2023-04-26 | Discharge: 2023-04-26 | Discharge status: Against Medical Advice | Payer: Medicaid Other | Attending: Emergency Medicine | Admitting: Emergency Medicine

## 2023-04-26 DIAGNOSIS — R519 Headache, unspecified: Secondary | ICD-10-CM | POA: Diagnosis present

## 2023-04-26 DIAGNOSIS — H9203 Otalgia, bilateral: Secondary | ICD-10-CM | POA: Diagnosis not present

## 2023-04-26 DIAGNOSIS — Z5321 Procedure and treatment not carried out due to patient leaving prior to being seen by health care provider: Secondary | ICD-10-CM | POA: Diagnosis not present

## 2023-04-26 LAB — URINALYSIS, ROUTINE W REFLEX MICROSCOPIC
Bilirubin Urine: NEGATIVE
Glucose, UA: NEGATIVE mg/dL
Hgb urine dipstick: NEGATIVE
Ketones, ur: NEGATIVE mg/dL
Leukocytes,Ua: NEGATIVE
Nitrite: NEGATIVE
Protein, ur: NEGATIVE mg/dL
Specific Gravity, Urine: 1.016 (ref 1.005–1.030)
pH: 8 (ref 5.0–8.0)

## 2023-04-26 LAB — BASIC METABOLIC PANEL
Anion gap: 11 (ref 5–15)
BUN: 9 mg/dL (ref 6–20)
CO2: 22 mmol/L (ref 22–32)
Calcium: 9.2 mg/dL (ref 8.9–10.3)
Chloride: 105 mmol/L (ref 98–111)
Creatinine, Ser: 0.56 mg/dL (ref 0.44–1.00)
GFR, Estimated: >60 mL/min (ref >60)
Glucose, Bld: 84 mg/dL (ref 70–99)
Potassium: 3.9 mmol/L (ref 3.5–5.1)
Sodium: 138 mmol/L (ref 135–145)

## 2023-04-26 LAB — CBC WITH DIFFERENTIAL/PLATELET
Abs Immature Granulocytes: 0.02 10*3/uL (ref 0.00–0.07)
Basophils Absolute: 0.1 10*3/uL (ref 0.0–0.1)
Basophils Relative: 1 %
Eosinophils Absolute: 0.2 10*3/uL (ref 0.0–0.5)
Eosinophils Relative: 3 %
HCT: 40.9 % (ref 36.0–46.0)
Hemoglobin: 13.5 g/dL (ref 12.0–15.0)
Immature Granulocytes: 0 %
Lymphocytes Relative: 52 %
Lymphs Abs: 3.2 10*3/uL (ref 0.7–4.0)
MCH: 26.9 pg (ref 26.0–34.0)
MCHC: 33 g/dL (ref 30.0–36.0)
MCV: 81.5 fL (ref 80.0–100.0)
Monocytes Absolute: 0.4 10*3/uL (ref 0.1–1.0)
Monocytes Relative: 7 %
Neutro Abs: 2.3 10*3/uL (ref 1.7–7.7)
Neutrophils Relative %: 37 %
Platelets: 279 10*3/uL (ref 150–400)
RBC: 5.02 MIL/uL (ref 3.87–5.11)
RDW: 13.7 % (ref 11.5–15.5)
WBC: 6.2 10*3/uL (ref 4.0–10.5)
nRBC: 0 % (ref 0.0–0.2)

## 2023-04-26 LAB — PREGNANCY, URINE: Preg Test, Ur: NEGATIVE

## 2023-04-26 NOTE — ED Provider Triage Note (Signed)
Emergency Medicine Provider Triage Evaluation Note  Gina Holmes , a 22 y.o. female  was evaluated in triage.  Pt complains of frontal headache with occasional bilateral ear pain.  Denies any runny nose nasal gesturing, neck stiffness, fever.  Denies any new visual changes.  No nausea or vomiting.  Show Excedrin but is still having headaches.  Review of Systems  Positive:  Negative:   Physical Exam  BP 115/67 (BP Location: Left Arm)   Pulse 91   Temp 98.2 F (36.8 C) (Oral)   Resp 16   LMP 04/17/2023   SpO2 99%  Gen:   Awake, no distress   Resp:  Normal effort  MSK:   Moves extremities without difficulty  Other:  Strength is intact in upper and lower bilateral extremities.  No facial droop.  Cranial nerves grossly intact.  She is answer questions properly with appropriate speech.  Bilateral TMs are well-appearing. No nuchal rigidity.  Medical Decision Making  Medically screening exam initiated at 8:02 PM.  Appropriate orders placed.  Dazia Gockley was informed that the remainder of the evaluation will be completed by another provider, this initial triage assessment does not replace that evaluation, and the importance of remaining in the ED until their evaluation is complete.     Achille Rich, PA-C 04/26/23 2005

## 2023-04-26 NOTE — ED Triage Notes (Addendum)
Pt reports pressure in face and headache. Denies fever and neck stiffness. This has been going on for a week. Reports it started with her ear and is having ear pain. Pt has taken Excedrin OTC. Pt reports has blurry viison bc supposed to wear glasses and is more blurry than baseline.

## 2023-04-26 NOTE — ED Notes (Signed)
Patient left.

## 2023-04-29 NOTE — Plan of Care (Signed)
[facility] Tonsillectomy/Adenoidectomy, Postoperative PEDS care plan entered in error.

## 2023-06-11 ENCOUNTER — Ambulatory Visit (INDEPENDENT_AMBULATORY_CARE_PROVIDER_SITE_OTHER): Payer: Medicaid Other

## 2023-06-11 ENCOUNTER — Encounter (HOSPITAL_COMMUNITY): Payer: Self-pay

## 2023-06-11 ENCOUNTER — Ambulatory Visit (HOSPITAL_COMMUNITY)
Admission: EM | Admit: 2023-06-11 | Discharge: 2023-06-11 | Disposition: A | Discharge status: Home / Self Care | Payer: Medicaid Other | Attending: Emergency Medicine | Admitting: Emergency Medicine

## 2023-06-11 DIAGNOSIS — R519 Headache, unspecified: Secondary | ICD-10-CM | POA: Diagnosis not present

## 2023-06-11 DIAGNOSIS — N898 Other specified noninflammatory disorders of vagina: Secondary | ICD-10-CM | POA: Diagnosis not present

## 2023-06-11 DIAGNOSIS — J3489 Other specified disorders of nose and nasal sinuses: Secondary | ICD-10-CM | POA: Insufficient documentation

## 2023-06-11 DIAGNOSIS — J4521 Mild intermittent asthma with (acute) exacerbation: Secondary | ICD-10-CM | POA: Diagnosis present

## 2023-06-11 DIAGNOSIS — F17210 Nicotine dependence, cigarettes, uncomplicated: Secondary | ICD-10-CM | POA: Diagnosis not present

## 2023-06-11 LAB — HIV ANTIBODY (ROUTINE TESTING W REFLEX): HIV Screen 4th Generation wRfx: NONREACTIVE

## 2023-06-11 MED ORDER — ALBUTEROL SULFATE HFA 108 (90 BASE) MCG/ACT IN AERS: 1.0000 | INHALATION_SPRAY | Freq: Four times a day (QID) | RESPIRATORY_TRACT | 0 refills | Status: AC | PRN

## 2023-06-11 MED ORDER — PREDNISONE 20 MG PO TABS: 40.0000 mg | ORAL_TABLET | Freq: Every day | ORAL | 0 refills | Status: AC

## 2023-06-11 MED ORDER — PROMETHAZINE-DM 6.25-15 MG/5ML PO SYRP: 5.0000 mL | ORAL_SOLUTION | Freq: Four times a day (QID) | ORAL | 0 refills | Status: DC | PRN

## 2023-06-11 NOTE — ED Provider Notes (Signed)
[facility]    CSN: [id number] Arrival date & time: 06/11/23  1824      History   Chief Complaint Chief Complaint  Patient presents with   Cough    HPI Gina Holmes is a 22 y.o. female.   Patient presents to clinic for complaints of a cough, rhinorrhea, congestion, sore throat, headache, shortness of breath and wheezing for the past 5 days.  She is unsure if she had a fever, did feel warm yesterday.  She has not taken any medication or tried any interventions for her symptoms.  She does have a history of asthma, does not have an albuterol inhaler at home.  She also would like to be tested for sexually transmitted infections.  Her vaginal discharge has been different for the past 2 weeks.  Feels like she has had some extra discharge, maybe it is clear or white she is unsure.  She has not had any vaginal itching.  Will have a intermittent odor.  She is sexually active with a female, is unsure if she has other female partners or if she cleans the toys properly.    Denies dysuria, flank pain, nausea, vomiting or urinary symptoms.  The history is provided by the patient and medical records.  Cough   Past Medical History:  Diagnosis Date   Asthma     Patient Active Problem List   Diagnosis Date Noted   Cesarean delivery, delivered, current hospitalization 06/19/2021   Arrest of dilation, delivered, current hospitalization 06/19/2021   Anemia due to blood loss, acute 06/19/2021   Polyhydramnios 06/15/2021   Asthma 05/24/2021   Marijuana use 11/24/2020   Hyperemesis affecting pregnancy, antepartum 11/24/2020    Past Surgical History:  Procedure Laterality Date   CESAREAN SECTION  06/17/2021   Procedure: CESAREAN SECTION;  Surgeon: Reva Bores, MD;  Location: [facility];  Service: Obstetrics;;   NO PAST SURGERIES      OB History     Gravida  2   Para  1   Term  1   Preterm      AB  1   Living  1      SAB  1   IAB      Ectopic      Multiple   0   Live Births  1            Home Medications    Prior to Admission medications   Medication Sig Start Date End Date Taking? Authorizing Provider  albuterol (VENTOLIN HFA) 108 (90 Base) MCG/ACT inhaler Inhale 1-2 puffs into the lungs every 6 (six) hours as needed for wheezing or shortness of breath. 06/11/23  Yes Rinaldo Ratel, [place] N, FNP  predniSONE (DELTASONE) 20 MG tablet Take 2 tablets (40 mg total) by mouth daily for 5 days. 06/11/23 06/16/23 Yes Rinaldo Ratel, [place] N, FNP  promethazine-dextromethorphan (PROMETHAZINE-DM) 6.25-15 MG/5ML syrup Take 5 mLs by mouth 4 (four) times daily as needed for cough. 06/11/23  Yes Rinaldo Ratel, [place] N, FNP  acetaminophen (TYLENOL) 500 MG tablet Take 1 tablet (500 mg total) by mouth every 4 (four) hours as needed. 06/25/22   Rising, Lurena Joiner, PA-C  albuterol (VENTOLIN HFA) 108 (90 Base) MCG/ACT inhaler Inhale 1-2 puffs into the lungs every 6 (six) hours as needed for wheezing or shortness of breath.    [provider]  ibuprofen (ADVIL) 800 MG tablet Take 1 tablet (800 mg total) by mouth every 6 (six) hours as needed. 06/25/22   Rising, Lurena Joiner,  PA-C    Family History Family History  Problem Relation Age of Onset   Obesity Mother    Obesity Father    Obesity Sister    Cancer Maternal Grandfather     Social History Social History   Tobacco Use   Smoking status: Every Day    Current packs/day: 0.25    Types: Cigarettes   Smokeless tobacco: Never   Tobacco comments:    1 cigarrette per day  Vaping Use   Vaping status: Never Used  Substance Use Topics   Alcohol use: Yes    Comment: socially   Drug use: Yes    Types: Marijuana    Comment: daily     Allergies   Black walnut flavor, Cherry, Apple juice, Peach flavor, and Pear   Review of Systems Review of Systems  Per HPI   Physical Exam Triage Vital Signs ED Triage Vitals [06/11/23 1904]  Encounter Vitals Group     BP 107/71     Systolic BP Percentile       Diastolic BP Percentile      Pulse Rate 78     Resp 16     Temp 98.7 F (37.1 C)     Temp Source Oral     SpO2 96 %     Weight 230 lb (104.3 kg)     Height 5' 1.5" (1.562 m)     Head Circumference      Peak Flow      Pain Score 6     Pain Loc      Pain Education      Exclude from Growth Chart    No data found.  Updated Vital Signs BP 107/71 (BP Location: Left Arm)   Pulse 78   Temp 98.7 F (37.1 C) (Oral)   Resp 16   Ht 5' 1.5" (1.562 m)   Wt 230 lb (104.3 kg)   LMP 06/05/2023 (Approximate)   SpO2 96%   Breastfeeding No   BMI 42.75 kg/m   Visual Acuity Right Eye Distance:   Left Eye Distance:   Bilateral Distance:    Right Eye Near:   Left Eye Near:    Bilateral Near:     Physical Exam Vitals and nursing note reviewed.  Constitutional:      Appearance: Normal appearance.  HENT:     Head: Normocephalic and atraumatic.     Right Ear: External ear normal.     Left Ear: External ear normal.     Nose: Congestion and rhinorrhea present.     Mouth/Throat:     Mouth: Mucous membranes are moist.     Pharynx: Posterior oropharyngeal erythema present.  Eyes:     Conjunctiva/sclera: Conjunctivae normal.  Cardiovascular:     Rate and Rhythm: Normal rate and regular rhythm.     Heart sounds: Normal heart sounds. No murmur heard. Pulmonary:     Effort: Pulmonary effort is normal.     Breath sounds: Wheezing present.  Musculoskeletal:        General: Normal range of motion.     Cervical back: Normal range of motion.  Skin:    General: Skin is warm and dry.  Neurological:     General: No focal deficit present.     Mental Status: She is alert.  Psychiatric:        Mood and Affect: Mood normal.      UC Treatments / Results  Labs (all labs ordered are listed, but only abnormal results  are displayed) Labs Reviewed  RPR  HIV ANTIBODY (ROUTINE TESTING W REFLEX)  CERVICOVAGINAL ANCILLARY ONLY    EKG   Radiology No results  found.  Procedures Procedures (including critical care time)  Medications Ordered in UC Medications - No data to display  Initial Impression / Assessment and Plan / UC Course  I have reviewed the triage vital signs and the nursing notes.  Pertinent labs & imaging results that were available during my care of the patient were reviewed by me and considered in my medical decision making (see chart for details).  Vitals and triage reviewed, patient is hemodynamically stable.  Lungs with expiratory wheezing in lower lobes, significantly worse on the right than the left.  Chest x-ray does not reveal any acute infiltrate, awaiting official radiology overread.  Staff will contact if antibiotics are indicated based on the radiology interpretation.  Steroid burst and albuterol inhaler sent in for asthma exacerbation.  Symptomatic cough management reviewed.  Changes to vaginal discharge, cytology swab obtained.  HIV and syphilis screening also obtained.  Staff will reach out if any abnormalities.  UA deferred as no dysuria or urinary symptoms.   Plan of care, follow-up care return precautions given, no questions at this time.     Final Clinical Impressions(s) / UC Diagnoses   Final diagnoses:  Vaginal discharge  Mild intermittent asthma with acute exacerbation     Discharge Instructions      I do not see any obvious pneumonia on your x-ray, our staff will contact you if the official radiology read is different from mine.  Start the steroids tomorrow with breakfast.  Use the albuterol inhaler as needed for any wheezing or shortness of breath.  He can use the cough syrup as needed, this may cause drowsiness so do not drink or drive on this medication.  You have also been screened for sexually transmitted infections as well as bacterial vaginosis and yeast.  Our staff will contact you if treatment is indicated.  In the meantime, abstain from any intercourse until all results have been  received.  Your symptoms should improve with the medication.  If no improvement or any changes please follow-up with your primary care provider or return to clinic.      ED Prescriptions     Medication Sig Dispense Auth. Provider   predniSONE (DELTASONE) 20 MG tablet Take 2 tablets (40 mg total) by mouth daily for 5 days. 10 tablet Rinaldo Ratel, [place] N, [place]   promethazine-dextromethorphan (PROMETHAZINE-DM) 6.25-15 MG/5ML syrup Take 5 mLs by mouth 4 (four) times daily as needed for cough. 118 mL Rinaldo Ratel, [place] N, FNP   albuterol (VENTOLIN HFA) 108 (90 Base) MCG/ACT inhaler Inhale 1-2 puffs into the lungs every 6 (six) hours as needed for wheezing or shortness of breath. 18 g Petra Sargeant, [place] N, [place]      PDMP not reviewed this encounter.   Jasemine Nawaz, [place] N, [place] 06/11/23 [phone]

## 2023-06-11 NOTE — Discharge Instructions (Signed)
I do not see any obvious pneumonia on your x-ray, our staff will contact you if the official radiology read is different from mine.  Start the steroids tomorrow with breakfast.  Use the albuterol inhaler as needed for any wheezing or shortness of breath.  He can use the cough syrup as needed, this may cause drowsiness so do not drink or drive on this medication.  You have also been screened for sexually transmitted infections as well as bacterial vaginosis and yeast.  Our staff will contact you if treatment is indicated.  In the meantime, abstain from any intercourse until all results have been received.  Your symptoms should improve with the medication.  If no improvement or any changes please follow-up with your primary care provider or return to clinic.

## 2023-06-11 NOTE — ED Triage Notes (Signed)
Patient here today with c/o cough, runny nose, congestion, ST, HA, and SOB X 4-5 days.   Patient would also like to be tested for STDs.

## 2023-06-12 ENCOUNTER — Telehealth: Payer: Self-pay

## 2023-06-12 LAB — RPR: RPR Ser Ql: NONREACTIVE

## 2023-06-12 MED ORDER — METRONIDAZOLE 500 MG PO TABS
500.0000 mg | ORAL_TABLET | Freq: Two times a day (BID) | ORAL | 0 refills | Status: AC
Start: 2023-06-12 — End: 2023-06-19

## 2023-06-12 NOTE — Telephone Encounter (Signed)
Per protocol, pt requires tx with metronidazole. Rx sent to pharmacy on file.

## 2023-06-13 LAB — CERVICOVAGINAL ANCILLARY ONLY
Bacterial Vaginitis (gardnerella): POSITIVE — AB
Candida Glabrata: NEGATIVE
Candida Vaginitis: NEGATIVE
Chlamydia: NEGATIVE
Comment: NEGATIVE
Comment: NEGATIVE
Comment: NEGATIVE
Comment: NEGATIVE
Comment: NEGATIVE
Comment: NORMAL
Neisseria Gonorrhea: NEGATIVE
Trichomonas: NEGATIVE

## 2023-07-02 ENCOUNTER — Encounter (HOSPITAL_COMMUNITY): Payer: Self-pay

## 2023-07-02 ENCOUNTER — Emergency Department (HOSPITAL_COMMUNITY)
Admission: EM | Admit: 2023-07-02 | Discharge: 2023-07-02 | Disposition: A | Discharge status: Home / Self Care | Payer: Medicaid Other | Attending: Emergency Medicine | Admitting: Emergency Medicine

## 2023-07-02 ENCOUNTER — Emergency Department (HOSPITAL_COMMUNITY): Payer: Medicaid Other

## 2023-07-02 ENCOUNTER — Other Ambulatory Visit: Payer: Self-pay

## 2023-07-02 DIAGNOSIS — X58XXXA Exposure to other specified factors, initial encounter: Secondary | ICD-10-CM | POA: Insufficient documentation

## 2023-07-02 DIAGNOSIS — S5011XA Contusion of right forearm, initial encounter: Secondary | ICD-10-CM | POA: Insufficient documentation

## 2023-07-02 DIAGNOSIS — R0789 Other chest pain: Secondary | ICD-10-CM

## 2023-07-02 DIAGNOSIS — R079 Chest pain, unspecified: Secondary | ICD-10-CM | POA: Insufficient documentation

## 2023-07-02 DIAGNOSIS — M79601 Pain in right arm: Secondary | ICD-10-CM | POA: Diagnosis present

## 2023-07-02 DIAGNOSIS — T148XXA Other injury of unspecified body region, initial encounter: Secondary | ICD-10-CM

## 2023-07-02 LAB — CBC
HCT: 43.1 % (ref 36.0–46.0)
Hemoglobin: 14.1 g/dL (ref 12.0–15.0)
MCH: 27.3 pg (ref 26.0–34.0)
MCHC: 32.7 g/dL (ref 30.0–36.0)
MCV: 83.5 fL (ref 80.0–100.0)
Platelets: 239 10*3/uL (ref 150–400)
RBC: 5.16 MIL/uL — ABNORMAL HIGH (ref 3.87–5.11)
RDW: 13 % (ref 11.5–15.5)
WBC: 5.2 10*3/uL (ref 4.0–10.5)
nRBC: 0 % (ref 0.0–0.2)

## 2023-07-02 LAB — TROPONIN I (HIGH SENSITIVITY)
Troponin I (High Sensitivity): 3 ng/L (ref <18)
Troponin I (High Sensitivity): <2 ng/L (ref <18)

## 2023-07-02 LAB — BASIC METABOLIC PANEL
Anion gap: 12 (ref 5–15)
BUN: 9 mg/dL (ref 6–20)
CO2: 18 mmol/L — ABNORMAL LOW (ref 22–32)
Calcium: 9.1 mg/dL (ref 8.9–10.3)
Chloride: 104 mmol/L (ref 98–111)
Creatinine, Ser: 0.69 mg/dL (ref 0.44–1.00)
GFR, Estimated: >60 mL/min (ref >60)
Glucose, Bld: 87 mg/dL (ref 70–99)
Potassium: 3.6 mmol/L (ref 3.5–5.1)
Sodium: 134 mmol/L — ABNORMAL LOW (ref 135–145)

## 2023-07-02 LAB — D-DIMER, QUANTITATIVE: D-Dimer, Quant: 0.42 ug{FEU}/mL (ref 0.00–0.50)

## 2023-07-02 LAB — HCG, SERUM, QUALITATIVE: Preg, Serum: NEGATIVE

## 2023-07-02 MED ORDER — ACETAMINOPHEN 325 MG PO TABS
650.0000 mg | ORAL_TABLET | Freq: Once | ORAL | Status: AC
  Administered 2023-07-02: 650 mg via ORAL
  Filled 2023-07-02: qty 2

## 2023-07-02 MED ORDER — NAPROXEN 500 MG PO TABS: 500.0000 mg | ORAL_TABLET | Freq: Two times a day (BID) | ORAL | 0 refills | Status: DC

## 2023-07-02 NOTE — ED Triage Notes (Addendum)
Patient states at work she noticed she had a bruise on her around around 6pm yesterday. States now she is having arm pain intermittently. Also states she is having chest pain that started around 1pm, midsternal. Never had chest pain before. Rates pain 5/10. Take Nexplanon.

## 2023-07-02 NOTE — ED Notes (Signed)
Nurse starting IV 

## 2023-07-02 NOTE — ED Provider Notes (Signed)
[place] [facility] AT [facility] Provider Note   CSN: [id number] Arrival date & time: 07/02/23  0407     History  Chief Complaint  Patient presents with   Arm Injury   Chest Pain    Gina Holmes is a 22 y.o. female.  Patient with a history of asthma.  She is here with pain to her right arm.  She noticed a bruise to her right palmar forearm around 6 PM yesterday does not know how it happened.  Denies any trauma.  She states since then she has had shooting pain going up and down her arm ever since.  Did not try to take anything for it at home.  No focal weakness, numbness or tingling.  She is also been having constant chest pain since approximately 1 AM.  Contrary to triage note is not 1 PM.  The pain is sharp and stabbing in the center of her chest lasting for up to 5 minutes at a time.  No radiation of the pain to her arm, neck or back.  Some shortness of breath.  She is concerned because she takes Nexplanon and her sister had a blood clot while taking birth control.  She denies any diaphoresis.  No vomiting.  No cough or fever.  She feels her breathing is at baseline.  No abdominal pain.  Denies any cardiac history.  Pain in her chest seems to be random at times it is not exertional or pleuritic.  She estimates she has had this pain approximately 10-15 times since 1 AM.  The history is provided by the patient.  Arm Injury Associated symptoms: no fever   Chest Pain Associated symptoms: shortness of breath   Associated symptoms: no abdominal pain, no fever, no headache, no nausea, no vomiting and no weakness        Home Medications Prior to Admission medications   Medication Sig Start Date End Date Taking? Authorizing Provider  acetaminophen (TYLENOL) 500 MG tablet Take 1 tablet (500 mg total) by mouth every 4 (four) hours as needed. 06/25/22   Rising, Lurena Joiner, PA-C  albuterol (VENTOLIN HFA) 108 (90 Base) MCG/ACT inhaler Inhale 1-2 puffs into the lungs every  6 (six) hours as needed for wheezing or shortness of breath.    [provider]  albuterol (VENTOLIN HFA) 108 (90 Base) MCG/ACT inhaler Inhale 1-2 puffs into the lungs every 6 (six) hours as needed for wheezing or shortness of breath. 06/11/23   Garrison, [place] N, FNP  ibuprofen (ADVIL) 800 MG tablet Take 1 tablet (800 mg total) by mouth every 6 (six) hours as needed. 06/25/22   Rising, Lurena Joiner, PA-C  promethazine-dextromethorphan (PROMETHAZINE-DM) 6.25-15 MG/5ML syrup Take 5 mLs by mouth 4 (four) times daily as needed for cough. 06/11/23   Garrison, [place] N, FNP      Allergies    Black walnut flavoring agent (non-screening), Cherry, Apple juice, Peach flavoring agent (non-screening), and Pear    Review of Systems   Review of Systems  Constitutional:  Negative for activity change, appetite change and fever.  HENT:  Negative for congestion and rhinorrhea.   Respiratory:  Positive for chest tightness and shortness of breath.   Cardiovascular:  Positive for chest pain.  Gastrointestinal:  Negative for abdominal pain, nausea and vomiting.  Genitourinary:  Negative for dysuria and hematuria.  Musculoskeletal:  Positive for arthralgias and myalgias.  Skin:  Negative for rash.  Neurological:  Negative for weakness and headaches.   all other systems are  negative except as noted in the HPI and PMH.    Physical Exam Updated Vital Signs BP (!) 113/58 (BP Location: Right Arm)   Pulse 93   Temp 98.4 F (36.9 C)   Resp 18   Ht 5\' 1"  (1.549 m)   Wt 102.1 kg   LMP 06/05/2023 (Approximate)   SpO2 99%   BMI 42.51 kg/m  Physical Exam Vitals and nursing note reviewed.  Constitutional:      General: She is not in acute distress.    Appearance: She is well-developed. She is not ill-appearing.  HENT:     Head: Normocephalic and atraumatic.     Mouth/Throat:     Pharynx: No oropharyngeal exudate.  Eyes:     Conjunctiva/sclera: Conjunctivae normal.     Pupils: Pupils are equal,  round, and reactive to light.  Neck:     Comments: No meningismus. Cardiovascular:     Rate and Rhythm: Normal rate and regular rhythm.     Heart sounds: Normal heart sounds. No murmur heard. Pulmonary:     Effort: Pulmonary effort is normal. No respiratory distress.     Breath sounds: Normal breath sounds.  Chest:     Chest wall: No tenderness.  Abdominal:     Palpations: Abdomen is soft.     Tenderness: There is no abdominal tenderness. There is no guarding or rebound.  Musculoskeletal:        General: No tenderness. Normal range of motion.     Cervical back: Normal range of motion and neck supple.     Comments: Small quarter size bruise to right palmar forearm.  Full range of motion of right shoulder, elbow, wrist without pain.  Radial pulse intact, cardinal hand movements intact.  Skin:    General: Skin is warm.  Neurological:     Mental Status: She is alert and oriented to person, place, and time.     Cranial Nerves: No cranial nerve deficit.     Motor: No abnormal muscle tone.     Coordination: Coordination normal.     Comments:  5/5 strength throughout. CN 2-12 intact.Equal grip strength.   Psychiatric:        Behavior: Behavior normal.     ED Results / Procedures / Treatments   Labs (all labs ordered are listed, but only abnormal results are displayed) Labs Reviewed  BASIC METABOLIC PANEL - Abnormal; Notable for the following components:      Result Value   Sodium 134 (*)    CO2 18 (*)    All other components within normal limits  CBC - Abnormal; Notable for the following components:   RBC 5.16 (*)    All other components within normal limits  HCG, SERUM, QUALITATIVE  D-DIMER, QUANTITATIVE  TROPONIN I (HIGH SENSITIVITY)  TROPONIN I (HIGH SENSITIVITY)    EKG EKG Interpretation Date/Time:  Monday July 02 2023 04:28:18 EST Ventricular Rate:  83 PR Interval:  134 QRS Duration:  76 QT Interval:  344 QTC Calculation: 404 R Axis:   78  Text  Interpretation: Normal sinus rhythm with sinus arrhythmia Nonspecific T wave abnormality Abnormal ECG When compared with ECG of 24-May-2021 13:45, PREVIOUS ECG IS PRESENT No significant change was found Confirmed by Glynn Octave [phone]) on 07/02/2023 5:09:38 AM  Radiology DG Forearm Right Result Date: 07/02/2023 CLINICAL DATA:  Bruising to the anterior forearm. EXAM: RIGHT FOREARM - 2 VIEW COMPARISON:  None Available. FINDINGS: There is no evidence of fracture or other focal bone lesions. Soft  tissues are unremarkable. IMPRESSION: Negative. Electronically Signed   By: Tiburcio Pea M.D.   On: 07/02/2023 06:03   DG Chest 2 View Result Date: 07/02/2023 CLINICAL DATA:  22 year old female with history of chest pain. EXAM: CHEST - 2 VIEW COMPARISON:  Chest x-ray 06/11/2023. FINDINGS: Lung volumes are normal. No consolidative airspace disease. No pleural effusions. No pneumothorax. No pulmonary nodule or mass noted. Pulmonary vasculature and the cardiomediastinal silhouette are within normal limits. IMPRESSION: No radiographic evidence of acute cardiopulmonary disease. Electronically Signed   By: Trudie Reed M.D.   On: 07/02/2023 05:25    Procedures Procedures    Medications Ordered in ED Medications  acetaminophen (TYLENOL) tablet 650 mg (has no administration in time range)    ED Course/ Medical Decision Making/ A&P                                 Medical Decision Making Amount and/or Complexity of Data Reviewed Labs: ordered. Decision-making details documented in ED Course. Radiology: ordered and independent interpretation performed. Decision-making details documented in ED Course. ECG/medicine tests: ordered and independent interpretation performed. Decision-making details documented in ED Course.  Risk OTC drugs. Prescription drug management.   Bruise to right arm with atraumatic pain.  Also intermittent chest pain since 1 AM.  EKG without acute ischemia.  Low suspicion  for ACS or PE.  Given her birth control use and family history of clots will screen with D-dimer.  Obtain forearm x-ray but low suspicion for fracture.  Forearm x-rays negative for fracture.  Bruise is likely posttraumatic.  She has no thrombocytopenia or anemia.  D-dimer negative with low suspicion for DVT or PE.  Troponin negative x 2 with low suspicion for ACS.  Chest pain is atypical for ACS, pulmonary embolism, aortic dissection.  Discussed with patient supportive care, PCP follow-up.  Return to the ED with exertional chest pain, pain associated with shortness of breath, nausea, vomiting, sweating or other concerns         Final Clinical Impression(s) / ED Diagnoses Final diagnoses:  Atypical chest pain  Bruise    Rx / DC Orders ED Discharge Orders     None         Garland Hincapie, Jeannett Senior, MD 07/02/23 [phone]

## 2023-07-02 NOTE — Discharge Instructions (Signed)
No evidence of heart attack or blood clot in the lung.  Use anti-inflammatories as needed for the pain in your arm.  Follow-up with your primary doctor.  Return to the ED with exertional chest pain, pain associate with shortness of breath, nausea, vomiting, sweating or other concerns.

## 2023-09-15 ENCOUNTER — Emergency Department (HOSPITAL_COMMUNITY)
Admission: EM | Admit: 2023-09-15 | Discharge: 2023-09-16 | Disposition: A | Discharge status: Home / Self Care | Attending: Emergency Medicine | Admitting: Emergency Medicine

## 2023-09-15 ENCOUNTER — Other Ambulatory Visit: Payer: Self-pay

## 2023-09-15 ENCOUNTER — Encounter (HOSPITAL_COMMUNITY): Payer: Self-pay | Admitting: Emergency Medicine

## 2023-09-15 ENCOUNTER — Emergency Department (HOSPITAL_COMMUNITY)

## 2023-09-15 DIAGNOSIS — J45909 Unspecified asthma, uncomplicated: Secondary | ICD-10-CM | POA: Insufficient documentation

## 2023-09-15 DIAGNOSIS — Y990 Civilian activity done for income or pay: Secondary | ICD-10-CM | POA: Diagnosis not present

## 2023-09-15 DIAGNOSIS — W208XXA Other cause of strike by thrown, projected or falling object, initial encounter: Secondary | ICD-10-CM | POA: Diagnosis not present

## 2023-09-15 DIAGNOSIS — M542 Cervicalgia: Secondary | ICD-10-CM | POA: Diagnosis not present

## 2023-09-15 DIAGNOSIS — S0990XA Unspecified injury of head, initial encounter: Secondary | ICD-10-CM | POA: Insufficient documentation

## 2023-09-15 NOTE — ED Triage Notes (Signed)
 Patient reports having 15lb box fall on head at work. Denies LOC. Reports nausea + vomiting and intermittent blurred vision. GCS 15.

## 2023-09-16 MED ORDER — ACETAMINOPHEN 500 MG PO TABS: 1000.0000 mg | ORAL_TABLET | Freq: Once | ORAL | Status: DC

## 2023-09-16 MED ORDER — IBUPROFEN 400 MG PO TABS: 600.0000 mg | ORAL_TABLET | Freq: Once | ORAL | Status: DC

## 2023-09-16 MED ORDER — ONDANSETRON 4 MG PO TBDP: 4.0000 mg | ORAL_TABLET | Freq: Once | ORAL | Status: DC

## 2023-09-16 MED ORDER — ONDANSETRON HCL 4 MG PO TABS: 4.0000 mg | ORAL_TABLET | Freq: Four times a day (QID) | ORAL | 0 refills | Status: DC

## 2023-09-16 NOTE — ED Provider Notes (Signed)
 [facility] AT [facility] Provider Note   CSN: [id number] Arrival date & time: 09/15/23  2134     History  Chief Complaint  Patient presents with   Head Injury    Gina Holmes is a 23 y.o. female.  Patient is a 23 year old female with a past medical history of asthma presenting to the emergency department after a head injury.  Patient states that while at work she had a 15 pound box landed on her head.  She states that she had no loss of consciousness.  She states that she has been nauseous and vomited twice.  She states that her vision feels slightly blurred.  She denies any numbness or weakness in her arms or legs.  She denies any blood thinner use.  States that she is also having some mild pain on the right side of her neck.  The history is provided by the patient.  Head Injury      Home Medications Prior to Admission medications   Medication Sig Start Date End Date Taking? Authorizing Provider  ondansetron (ZOFRAN) 4 MG tablet Take 1 tablet (4 mg total) by mouth every 6 (six) hours. 09/16/23  Yes Elayne Snare K, DO  acetaminophen (TYLENOL) 500 MG tablet Take 1 tablet (500 mg total) by mouth every 4 (four) hours as needed. 06/25/22   Rising, Lurena Joiner, PA-C  albuterol (VENTOLIN HFA) 108 (90 Base) MCG/ACT inhaler Inhale 1-2 puffs into the lungs every 6 (six) hours as needed for wheezing or shortness of breath.    [provider]  albuterol (VENTOLIN HFA) 108 (90 Base) MCG/ACT inhaler Inhale 1-2 puffs into the lungs every 6 (six) hours as needed for wheezing or shortness of breath. 06/11/23   Garrison, [place] N, FNP  ibuprofen (ADVIL) 800 MG tablet Take 1 tablet (800 mg total) by mouth every 6 (six) hours as needed. 06/25/22   Rising, Lurena Joiner, PA-C  naproxen (NAPROSYN) 500 MG tablet Take 1 tablet (500 mg total) by mouth 2 (two) times daily with a meal. 07/02/23   Rancour, Jeannett Senior, MD  promethazine-dextromethorphan (PROMETHAZINE-DM) 6.25-15  MG/5ML syrup Take 5 mLs by mouth 4 (four) times daily as needed for cough. 06/11/23   Garrison, [place] N, FNP      Allergies    Black walnut flavoring agent (non-screening), Cherry, Apple juice, Peach flavoring agent (non-screening), and Pear    Review of Systems   Review of Systems  Physical Exam Updated Vital Signs BP 119/65 (BP Location: Left Arm)   Pulse (!) 52   Temp 98.3 F (36.8 C)   Resp 16   Ht 5\' 1"  (1.549 m)   Wt 99.8 kg   SpO2 96%   BMI 41.57 kg/m  Physical Exam Vitals and nursing note reviewed.  Constitutional:      General: She is not in acute distress.    Appearance: Normal appearance.  HENT:     Head: Normocephalic and atraumatic.     Nose: Nose normal.     Mouth/Throat:     Mouth: Mucous membranes are moist.     Pharynx: Oropharynx is clear.  Eyes:     Extraocular Movements: Extraocular movements intact.     Conjunctiva/sclera: Conjunctivae normal.     Pupils: Pupils are equal, round, and reactive to light.  Neck:     Comments: No midline neck tenderness, right sided cervical paraspinal muscle tenderness to palpation Cardiovascular:     Rate and Rhythm: Normal rate and regular rhythm.  Heart sounds: Normal heart sounds.  Pulmonary:     Effort: Pulmonary effort is normal.     Breath sounds: Normal breath sounds.  Abdominal:     General: Abdomen is flat.  Musculoskeletal:        General: Normal range of motion.     Cervical back: Normal range of motion and neck supple.  Skin:    General: Skin is warm and dry.  Neurological:     General: No focal deficit present.     Mental Status: She is alert and oriented to person, place, and time.     Cranial Nerves: No cranial nerve deficit.     Sensory: No sensory deficit.     Motor: No weakness.     Coordination: Coordination normal.  Psychiatric:        Mood and Affect: Mood normal.        Behavior: Behavior normal.     ED Results / Procedures / Treatments   Labs (all labs ordered are listed,  but only abnormal results are displayed) Labs Reviewed - No data to display  EKG None  Radiology CT HEAD WO CONTRAST ( ) Result Date: 09/15/2023 CLINICAL DATA:  Head trauma, moderate-severe EXAM: CT HEAD WITHOUT CONTRAST TECHNIQUE: Contiguous axial images were obtained from the base of the skull through the vertex without intravenous contrast. RADIATION DOSE REDUCTION: This exam was performed according to the departmental dose-optimization program which includes automated exposure control, adjustment of the mA and/or kV according to patient size and/or use of iterative reconstruction technique. COMPARISON:  None Available. FINDINGS: Brain: No evidence of acute large vascular territory infarction, hemorrhage, hydrocephalus, extra-axial collection or mass lesion/mass effect. Vascular: No hyperdense vessel. Skull: No acute fracture. Sinuses/Orbits: Clear sinuses.  No acute orbital findings. Other: No mastoid effusions. IMPRESSION: No evidence of acute intracranial abnormality. Electronically Signed   By: Feliberto Harts M.D.   On: 09/15/2023 22:53    Procedures Procedures    Medications Ordered in ED Medications  ibuprofen (ADVIL) tablet 600 mg (has no administration in time range)  acetaminophen (TYLENOL) tablet 1,000 mg (has no administration in time range)  ondansetron (ZOFRAN-ODT) disintegrating tablet 4 mg (has no administration in time range)    ED Course/ Medical Decision Making/ A&P                                 Medical Decision Making This patient presents to the ED with chief complaint(s) of head injury with pertinent past medical history of asthma which further complicates the presenting complaint. The complaint involves an extensive differential diagnosis and also carries with it a high risk of complications and morbidity.    The differential diagnosis includes concussion, considering ICH with repetitive vomiting, cervical muscle strain or spasm, no midline neck tenderness  and ROM intact making cervical fracture unlikely, no other traumatic injury seen on exam  Additional history obtained: Additional history obtained from N/A Records reviewed N/A  ED Course and Reassessment: On patient's arrival she was hemodynamically stable in no acute distress.  Was initially evaluated in triage and had head CT ordered.  Patient CT showed no acute abnormality.  Suspect likely mild concussion.  Patient was given Tylenol, Motrin and Zofran for symptomatic management and was recommended close outpatient follow-up.  She was given strict return precautions.  Independent labs interpretation:  N/A  Independent visualization of imaging: - I independently visualized the following imaging with scope of interpretation limited  to determining acute life threatening conditions related to emergency care: CTH, which revealed no acute disease  Consultation: - Consulted or discussed management/test interpretation w/ external professional: N/A  Consideration for admission or further workup: Patient has no emergent conditions requiring admission or further work-up at this time and is stable for discharge home with primary care follow-up  Social Determinants of health: N/A    Risk OTC drugs. Prescription drug management.          Final Clinical Impression(s) / ED Diagnoses Final diagnoses:  Injury of head, initial encounter    Rx / DC Orders ED Discharge Orders          Ordered    ondansetron (ZOFRAN) 4 MG tablet  Every 6 hours        09/16/23 0712              Rexford Maus, DO 09/16/23 [phone]

## 2023-09-16 NOTE — Discharge Instructions (Signed)
 You were seen in the emergency department after your head injury.  You had a CT scan of your brain that was normal.  You could have a mild concussion.  You can take Tylenol and Motrin as needed for your headache and both can be taken up to every 6 hours.  Have also given you Zofran that you can take as needed for nausea.  You can follow-up with your primary doctor in the next few days to have your symptoms rechecked.  You should return to the emergency department if you are having significantly worsening headache, repetitive vomiting despite the nausea medicine, numbness or weakness in one-sided body compared to the other or any other new or concerning symptoms.

## 2023-12-28 ENCOUNTER — Ambulatory Visit (HOSPITAL_COMMUNITY)
Admission: EM | Admit: 2023-12-28 | Discharge: 2023-12-28 | Disposition: A | Discharge status: Home / Self Care | Payer: Self-pay | Attending: Family Medicine | Admitting: Family Medicine

## 2023-12-28 ENCOUNTER — Encounter (HOSPITAL_COMMUNITY): Payer: Self-pay

## 2023-12-28 ENCOUNTER — Ambulatory Visit (HOSPITAL_COMMUNITY): Payer: Self-pay

## 2023-12-28 DIAGNOSIS — N939 Abnormal uterine and vaginal bleeding, unspecified: Secondary | ICD-10-CM | POA: Insufficient documentation

## 2023-12-28 DIAGNOSIS — Z3202 Encounter for pregnancy test, result negative: Secondary | ICD-10-CM

## 2023-12-28 DIAGNOSIS — R102 Pelvic and perineal pain: Secondary | ICD-10-CM

## 2023-12-28 LAB — CBC
HCT: 42.1 % (ref 36.0–46.0)
Hemoglobin: 14.1 g/dL (ref 12.0–15.0)
MCH: 27.5 pg (ref 26.0–34.0)
MCHC: 33.5 g/dL (ref 30.0–36.0)
MCV: 82.1 fL (ref 80.0–100.0)
Platelets: 256 10*3/uL (ref 150–400)
RBC: 5.13 MIL/uL — ABNORMAL HIGH (ref 3.87–5.11)
RDW: 13.5 % (ref 11.5–15.5)
WBC: 5.5 10*3/uL (ref 4.0–10.5)
nRBC: 0 % (ref 0.0–0.2)

## 2023-12-28 LAB — POCT URINALYSIS DIP (MANUAL ENTRY)
Glucose, UA: NEGATIVE mg/dL
Ketones, POC UA: NEGATIVE mg/dL
Leukocytes, UA: NEGATIVE
Nitrite, UA: NEGATIVE
Protein Ur, POC: 100 mg/dL — AB
Spec Grav, UA: >=1.03 — AB (ref 1.010–1.025)
Urobilinogen, UA: 1 U/dL
pH, UA: 6 (ref 5.0–8.0)

## 2023-12-28 LAB — POCT URINE PREGNANCY: Preg Test, Ur: NEGATIVE

## 2023-12-28 MED ORDER — IBUPROFEN 600 MG PO TABS
600.0000 mg | ORAL_TABLET | Freq: Three times a day (TID) | ORAL | 0 refills | Status: AC | PRN
Start: 2023-12-28 — End: ?

## 2023-12-28 MED ORDER — MEDROXYPROGESTERONE ACETATE 10 MG PO TABS: 10.0000 mg | ORAL_TABLET | Freq: Every day | ORAL | 0 refills | Status: AC

## 2023-12-28 NOTE — ED Triage Notes (Signed)
 Patient here today with c/o lower abd pain X 1 week and menstrual bleeding X 4 weeks.

## 2023-12-28 NOTE — Discharge Instructions (Signed)
 The pregnancy test was negative  The urinalysis only showed the red blood cells from your vaginal bleeding.  Take medroxyprogesterone  10 mg--1 tablet daily for 7 days.  Hopefully this medication will help you stop bleeding; then you should expect a withdrawal bleed 3 to 4 days after you finish this medication.  Take ibuprofen  600 mg--1 tab every 8 hours as needed for pain.

## 2023-12-28 NOTE — ED Provider Notes (Signed)
 [facility]    CSN: [id number] Arrival date & time: 12/28/23  1455      History   Chief Complaint Chief Complaint  Patient presents with   Abdominal Pain    HPI Gina Holmes is a 23 y.o. female.    Abdominal Pain Here for pelvic pain that has been going on for a week and then vaginal bleeding that has been going on for about 4 weeks.  Prior to this episode of bleeding she had had a more normal menstrual cycle about 2 weeks before  No fever or chills and no nausea or vomiting.  No dysuria  She does have a Nexplanon   NKDA  Past Medical History:  Diagnosis Date   Asthma     Patient Active Problem List   Diagnosis Date Noted   Cesarean delivery, delivered, current hospitalization 06/19/2021   Arrest of dilation, delivered, current hospitalization 06/19/2021   Anemia due to blood loss, acute 06/19/2021   Polyhydramnios 06/15/2021   Asthma 05/24/2021   Marijuana use 11/24/2020   Hyperemesis affecting pregnancy, antepartum 11/24/2020    Past Surgical History:  Procedure Laterality Date   CESAREAN SECTION  06/17/2021   Procedure: CESAREAN SECTION;  Surgeon: Granville Layer, MD;  Location: [facility] LD ORS;  Service: Obstetrics;;   NO PAST SURGERIES      OB History     Gravida  2   Para  1   Term  1   Preterm      AB  1   Living  1      SAB  1   IAB      Ectopic      Multiple  0   Live Births  1            Home Medications    Prior to Admission medications   Medication Sig Start Date End Date Taking? Authorizing Provider  EPINEPHrine  0.3 mg/0.3 mL IJ SOAJ injection Inject 0.3 mg into the muscle as needed for anaphylaxis. 09/14/22  Yes [provider]  etonogestrel  (NEXPLANON ) 68 MG IMPL implant 1 each by Subdermal route once.   Yes [provider]  ibuprofen  (ADVIL ) 600 MG tablet Take 1 tablet (600 mg total) by mouth every 8 (eight) hours as needed (pain). 12/28/23  Yes Ann Keto, MD  medroxyPROGESTERone   (PROVERA ) 10 MG tablet Take 1 tablet (10 mg total) by mouth daily for 10 days. 12/28/23 01/07/24 Yes Ann Keto, MD  acetaminophen  (TYLENOL ) 500 MG tablet Take 1 tablet (500 mg total) by mouth every 4 (four) hours as needed. 06/25/22   Rising, Ivette Marks, PA-C  albuterol  (VENTOLIN  HFA) 108 (90 Base) MCG/ACT inhaler Inhale 1-2 puffs into the lungs every 6 (six) hours as needed for wheezing or shortness of breath. 06/11/23   Lenita Quinones, FNP    Family History Family History  Problem Relation Age of Onset   Obesity Mother    Obesity Father    Obesity Sister    Cancer Maternal Grandfather     Social History Social History   Tobacco Use   Smoking status: Every Day    Current packs/day: 0.25    Types: Cigarettes   Smokeless tobacco: Never   Tobacco comments:    1 cigarrette per day  Vaping Use   Vaping status: Never Used  Substance Use Topics   Alcohol use: Yes    Comment: socially   Drug use: Yes    Types: Marijuana    Comment: daily  Allergies   Black walnut flavoring agent (non-screening), Cherry, Apple juice, Peach flavoring agent (non-screening), and Pear   Review of Systems Review of Systems  Gastrointestinal:  Positive for abdominal pain.     Physical Exam Triage Vital Signs ED Triage Vitals  Encounter Vitals Group     BP 12/28/23 1513 104/66     Girls Systolic BP Percentile --      Girls Diastolic BP Percentile --      Boys Systolic BP Percentile --      Boys Diastolic BP Percentile --      Pulse Rate 12/28/23 1513 84     Resp 12/28/23 1513 16     Temp 12/28/23 1513 98.5 F (36.9 C)     Temp Source 12/28/23 1513 Oral     SpO2 12/28/23 1513 96 %     Weight --      Height --      Head Circumference --      Peak Flow --      Pain Score 12/28/23 1515 7     Pain Loc --      Pain Education --      Exclude from Growth Chart --    No data found.  Updated Vital Signs BP 104/66 (BP Location: Left Arm)   Pulse 84   Temp 98.5 F (36.9 C)  (Oral)   Resp 16   LMP 12/28/2023 (Exact Date)   SpO2 96%   Visual Acuity Right Eye Distance:   Left Eye Distance:   Bilateral Distance:    Right Eye Near:   Left Eye Near:    Bilateral Near:     Physical Exam Vitals reviewed.  Constitutional:      General: She is not in acute distress.    Appearance: She is not ill-appearing, toxic-appearing or diaphoretic.  HENT:     Mouth/Throat:     Mouth: Mucous membranes are moist.   Eyes:     Extraocular Movements: Extraocular movements intact.     Pupils: Pupils are equal, round, and reactive to light.    Cardiovascular:     Rate and Rhythm: Normal rate and regular rhythm.     Heart sounds: No murmur heard. Pulmonary:     Effort: Pulmonary effort is normal.     Breath sounds: Normal breath sounds.  Abdominal:     Palpations: Abdomen is soft.     Tenderness: There is abdominal tenderness (Suprapubic).   Musculoskeletal:     Cervical back: Neck supple.   Skin:    Coloration: Skin is not pale.   Neurological:     General: No focal deficit present.     Mental Status: She is alert and oriented to person, place, and time.   Psychiatric:        Behavior: Behavior normal.      UC Treatments / Results  Labs (all labs ordered are listed, but only abnormal results are displayed) Labs Reviewed  POCT URINALYSIS DIP (MANUAL ENTRY) - Abnormal; Notable for the following components:      Result Value   Color, UA red (*)    Clarity, UA cloudy (*)    Bilirubin, UA small (*)    Spec Grav, UA >=1.030 (*)    Blood, UA large (*)    Protein Ur, POC =100 (*)    All other components within normal limits  CBC  POCT URINE PREGNANCY  CERVICOVAGINAL ANCILLARY ONLY    EKG   Radiology No results found.  Procedures  Procedures (including critical care time)  Medications Ordered in UC Medications - No data to display  Initial Impression / Assessment and Plan / UC Course  I have reviewed the triage vital signs and the nursing  notes.  Pertinent labs & imaging results that were available during my care of the patient were reviewed by me and considered in my medical decision making (see chart for details).     Urinalysis does not show any nitrites or leukocytes.  There is a large amount of blood.  UPT is negative  Vaginal self swab is done, and we will notify of any positives on that and treat per protocol.  CBC is also drawn to check for anemia.  She will be having her new patient appointment with a new primary care in August.  She is given contact information for OB/GYN  Provera  is sent into try to stop her bleeding and ibuprofen  is sent in for the cramps Final Clinical Impressions(s) / UC Diagnoses   Final diagnoses:  Abnormal uterine bleeding  Pelvic pain     Discharge Instructions      The pregnancy test was negative  The urinalysis only showed the red blood cells from your vaginal bleeding.  Take medroxyprogesterone  10 mg--1 tablet daily for 7 days.  Hopefully this medication will help you stop bleeding; then you should expect a withdrawal bleed 3 to 4 days after you finish this medication.  Take ibuprofen  600 mg--1 tab every 8 hours as needed for pain.      ED Prescriptions     Medication Sig Dispense Auth. Provider   medroxyPROGESTERone  (PROVERA ) 10 MG tablet Take 1 tablet (10 mg total) by mouth daily for 10 days. 10 tablet Ann Keto, MD   ibuprofen  (ADVIL ) 600 MG tablet Take 1 tablet (600 mg total) by mouth every 8 (eight) hours as needed (pain). 15 tablet Wrenn Willcox K, MD      PDMP not reviewed this encounter.   Ann Keto, MD 12/28/23 [phone]

## 2023-12-31 LAB — CERVICOVAGINAL ANCILLARY ONLY
Chlamydia: NEGATIVE
Comment: NEGATIVE
Comment: NEGATIVE
Comment: NORMAL
Neisseria Gonorrhea: NEGATIVE
Trichomonas: NEGATIVE

## 2024-01-28 ENCOUNTER — Other Ambulatory Visit: Payer: Self-pay | Admitting: Medical Genetics

## 2024-02-04 ENCOUNTER — Ambulatory Visit: Payer: Self-pay | Admitting: Internal Medicine

## 2024-02-08 ENCOUNTER — Ambulatory Visit: Admitting: Internal Medicine

## 2024-03-30 ENCOUNTER — Telehealth: Payer: Self-pay | Admitting: Family

## 2024-03-30 DIAGNOSIS — J029 Acute pharyngitis, unspecified: Secondary | ICD-10-CM

## 2024-03-30 MED ORDER — AMOXICILLIN 500 MG PO CAPS: 500.0000 mg | ORAL_CAPSULE | Freq: Two times a day (BID) | ORAL | 0 refills | Status: AC

## 2024-03-30 NOTE — Progress Notes (Signed)

## 2024-03-30 NOTE — Progress Notes (Signed)
Approximately 5 minutes was spent documenting and reviewing patient's chart.

## 2024-04-18 ENCOUNTER — Other Ambulatory Visit: Payer: Self-pay | Admitting: Medical Genetics

## 2024-04-18 DIAGNOSIS — Z006 Encounter for examination for normal comparison and control in clinical research program: Secondary | ICD-10-CM
